# Patient Record
Sex: Female | Born: 1950
Health system: Southern US, Community
[De-identification: ages and names within clinical notes are randomized; demographics above are authoritative.]

## PROBLEM LIST (undated history)

## (undated) DIAGNOSIS — E8881 Metabolic syndrome: Secondary | ICD-10-CM

## (undated) DIAGNOSIS — E782 Mixed hyperlipidemia: Secondary | ICD-10-CM

## (undated) DIAGNOSIS — I10 Essential (primary) hypertension: Secondary | ICD-10-CM

## (undated) DIAGNOSIS — N183 Chronic kidney disease, stage 3 unspecified: Secondary | ICD-10-CM

## (undated) DIAGNOSIS — M1990 Unspecified osteoarthritis, unspecified site: Secondary | ICD-10-CM

---

## 2008-06-17 ENCOUNTER — Emergency Department (HOSPITAL_COMMUNITY): Admission: EM | Admit: 2008-06-17 | Discharge: 2008-06-17 | Payer: Self-pay | Admitting: Emergency Medicine

## 2015-01-25 HISTORY — PX: BRAIN SURGERY: SHX531

## 2017-04-08 DIAGNOSIS — Z6837 Body mass index (BMI) 37.0-37.9, adult: Secondary | ICD-10-CM | POA: Diagnosis not present

## 2017-04-08 DIAGNOSIS — I10 Essential (primary) hypertension: Secondary | ICD-10-CM | POA: Diagnosis not present

## 2017-04-08 DIAGNOSIS — E8881 Metabolic syndrome: Secondary | ICD-10-CM | POA: Diagnosis not present

## 2017-04-08 DIAGNOSIS — E782 Mixed hyperlipidemia: Secondary | ICD-10-CM | POA: Diagnosis not present

## 2017-04-08 DIAGNOSIS — Z79899 Other long term (current) drug therapy: Secondary | ICD-10-CM | POA: Diagnosis not present

## 2017-04-08 DIAGNOSIS — M1990 Unspecified osteoarthritis, unspecified site: Secondary | ICD-10-CM | POA: Diagnosis not present

## 2017-04-08 DIAGNOSIS — N183 Chronic kidney disease, stage 3 (moderate): Secondary | ICD-10-CM | POA: Diagnosis not present

## 2017-06-28 DIAGNOSIS — Z6837 Body mass index (BMI) 37.0-37.9, adult: Secondary | ICD-10-CM | POA: Diagnosis not present

## 2017-06-28 DIAGNOSIS — Z1339 Encounter for screening examination for other mental health and behavioral disorders: Secondary | ICD-10-CM | POA: Diagnosis not present

## 2017-06-28 DIAGNOSIS — I10 Essential (primary) hypertension: Secondary | ICD-10-CM | POA: Diagnosis not present

## 2017-06-28 DIAGNOSIS — N183 Chronic kidney disease, stage 3 (moderate): Secondary | ICD-10-CM | POA: Diagnosis not present

## 2017-06-28 DIAGNOSIS — M791 Myalgia, unspecified site: Secondary | ICD-10-CM | POA: Diagnosis not present

## 2017-06-28 DIAGNOSIS — Z1331 Encounter for screening for depression: Secondary | ICD-10-CM | POA: Diagnosis not present

## 2017-06-28 DIAGNOSIS — M1990 Unspecified osteoarthritis, unspecified site: Secondary | ICD-10-CM | POA: Diagnosis not present

## 2017-07-29 DIAGNOSIS — M791 Myalgia, unspecified site: Secondary | ICD-10-CM | POA: Diagnosis not present

## 2017-07-29 DIAGNOSIS — N183 Chronic kidney disease, stage 3 (moderate): Secondary | ICD-10-CM | POA: Diagnosis not present

## 2017-07-29 DIAGNOSIS — R700 Elevated erythrocyte sedimentation rate: Secondary | ICD-10-CM | POA: Diagnosis not present

## 2017-07-29 DIAGNOSIS — I10 Essential (primary) hypertension: Secondary | ICD-10-CM | POA: Diagnosis not present

## 2017-07-29 DIAGNOSIS — Z6837 Body mass index (BMI) 37.0-37.9, adult: Secondary | ICD-10-CM | POA: Diagnosis not present

## 2017-08-27 DIAGNOSIS — Z6837 Body mass index (BMI) 37.0-37.9, adult: Secondary | ICD-10-CM | POA: Diagnosis not present

## 2017-08-27 DIAGNOSIS — E669 Obesity, unspecified: Secondary | ICD-10-CM | POA: Diagnosis not present

## 2017-08-27 DIAGNOSIS — M2550 Pain in unspecified joint: Secondary | ICD-10-CM | POA: Diagnosis not present

## 2017-09-19 DIAGNOSIS — M2550 Pain in unspecified joint: Secondary | ICD-10-CM | POA: Diagnosis not present

## 2017-09-19 DIAGNOSIS — Z6839 Body mass index (BMI) 39.0-39.9, adult: Secondary | ICD-10-CM | POA: Diagnosis not present

## 2017-09-19 DIAGNOSIS — E669 Obesity, unspecified: Secondary | ICD-10-CM | POA: Diagnosis not present

## 2017-09-30 DIAGNOSIS — M1612 Unilateral primary osteoarthritis, left hip: Secondary | ICD-10-CM | POA: Diagnosis not present

## 2017-09-30 DIAGNOSIS — M25552 Pain in left hip: Secondary | ICD-10-CM | POA: Diagnosis not present

## 2017-10-14 DIAGNOSIS — Z79899 Other long term (current) drug therapy: Secondary | ICD-10-CM | POA: Diagnosis not present

## 2017-10-14 DIAGNOSIS — M160 Bilateral primary osteoarthritis of hip: Secondary | ICD-10-CM | POA: Diagnosis not present

## 2017-10-14 DIAGNOSIS — Z1331 Encounter for screening for depression: Secondary | ICD-10-CM | POA: Diagnosis not present

## 2017-10-14 DIAGNOSIS — N183 Chronic kidney disease, stage 3 (moderate): Secondary | ICD-10-CM | POA: Diagnosis not present

## 2017-10-14 DIAGNOSIS — Z01818 Encounter for other preprocedural examination: Secondary | ICD-10-CM | POA: Diagnosis not present

## 2017-10-14 DIAGNOSIS — E782 Mixed hyperlipidemia: Secondary | ICD-10-CM | POA: Diagnosis not present

## 2017-10-14 DIAGNOSIS — Z6836 Body mass index (BMI) 36.0-36.9, adult: Secondary | ICD-10-CM | POA: Diagnosis not present

## 2017-10-16 DIAGNOSIS — M1612 Unilateral primary osteoarthritis, left hip: Secondary | ICD-10-CM | POA: Diagnosis not present

## 2017-10-16 DIAGNOSIS — M25552 Pain in left hip: Secondary | ICD-10-CM | POA: Diagnosis not present

## 2017-10-16 NOTE — H&P (Signed)
TOTAL HIP ADMISSION H&P  Patient is admitted for left total hip arthroplasty, anterior approach  Subjective:  Chief Complaint:    Left hip primary OA / pain  HPI: Sherry Robinson, 67 y.o. female, has a history of pain and functional disability in the left hip(s) due to arthritis and patient has failed non-surgical conservative treatments for greater than 12 weeks to include NSAID's and/or analgesics, use of assistive devices and activity modification.  Onset of symptoms was gradual starting ~1 years ago with gradually worsening course since that time.The patient noted no past surgery on the left hip(s).  Patient currently rates pain in the left hip at 10 out of 10 with activity. Patient has worsening of pain with activity and weight bearing, trendelenberg gait, pain that interfers with activities of daily living and pain with passive range of motion. Patient has evidence of periarticular osteophytes and joint space narrowing by imaging studies. This condition presents safety issues increasing the risk of falls. There is no current active infection.  Risks, benefits and expectations were discussed with the patient.  Risks including but not limited to the risk of anesthesia, blood clots, nerve damage, blood vessel damage, failure of the prosthesis, infection and up to and including death.  Patient understand the risks, benefits and expectations and wishes to proceed with surgery.   PCP: [facility]  D/C Plans:       Home  Post-op Meds:       No Rx given   Tranexamic Acid:      To be given - IV   Decadron:      Is to be given  FYI:      ASA  Norco  DME:   Rx given for - RW & 3-n-1  PT:   No PT    Past Medical History:  Diagnosis Date  . Arthritis   . Chronic kidney disease (CKD), stage III (moderate) (HCC)   . Hypercholesterolemia with hypertriglyceridemia   . Hypertension   . Metabolic syndrome   . Morbid obesity ([place])     Past Surgical History:  Procedure  Laterality Date  . BRAIN SURGERY  01/2015   pituitary adenoma removal      No current facility-administered medications for this encounter.    Current Outpatient Medications  Medication Sig Dispense Refill Last Dose  . acetaminophen (TYLENOL) 500 MG tablet Take 1,000 mg by mouth every 6 (six) hours as needed (for pain.).     Marland Kitchen cholecalciferol (VITAMIN D) 1000 units tablet Take 1,000 Units by mouth daily.     Marland Kitchen losartan-hydrochlorothiazide (HYZAAR) 100-12.5 MG tablet Take 1 tablet by mouth daily.  0   . rosuvastatin (CRESTOR) 20 MG tablet Take 20 mg by mouth daily.   1    Allergies  Allergen Reactions  . Lisinopril Cough     Social History   Tobacco Use  . Smoking status: Former Smoker    Last attempt to quit: 10/20/2016    Years since quitting: 1.0  . Smokeless tobacco: Never Used  Substance Use Topics  . Alcohol use: Never    Frequency: Never       Review of Systems  Constitutional: Negative.   HENT: Negative.   Eyes: Negative.   Respiratory: Negative.   Cardiovascular: Negative.   Gastrointestinal: Negative.   Genitourinary: Negative.   Musculoskeletal: Positive for joint pain.  Skin: Negative.   Neurological: Negative.   Endo/Heme/Allergies: Negative.   Psychiatric/Behavioral: Negative.     Objective:  Physical  Exam  Constitutional: She is oriented to person, place, and time. She appears well-developed.  HENT:  Head: Normocephalic.  Eyes: Pupils are equal, round, and reactive to light.  Neck: Neck supple. No JVD present. No tracheal deviation present. No thyromegaly present.  Cardiovascular: Normal rate, regular rhythm and intact distal pulses.  Respiratory: Effort normal and breath sounds normal. No respiratory distress. She has no wheezes.  GI: Soft. There is no tenderness. There is no guarding.  Musculoskeletal:       Left hip: She exhibits decreased range of motion, decreased strength, tenderness and bony tenderness. She exhibits no swelling, no  deformity and no laceration.  Lymphadenopathy:    She has no cervical adenopathy.  Neurological: She is alert and oriented to person, place, and time.  Skin: Skin is warm and dry.  Psychiatric: She has a normal mood and affect.      Imaging Review Plain radiographs demonstrate severe degenerative joint disease of the left hip(s). The bone quality appears to be good for age and reported activity level.    Preoperative templating of the joint replacement has been completed, documented, and submitted to the Operating Room personnel in order to optimize intra-operative equipment management.     Assessment/Plan:  End stage arthritis, left hip  The patient history, physical examination, clinical judgement of the provider and imaging studies are consistent with end stage degenerative joint disease of the left hip and total hip arthroplasty is deemed medically necessary. The treatment options including medical management, injection therapy, arthroscopy and arthroplasty were discussed at length. The risks and benefits of total hip arthroplasty were presented and reviewed. The risks due to aseptic loosening, infection, stiffness, dislocation/subluxation,  thromboembolic complications and other imponderables were discussed.  The patient acknowledged the explanation, agreed to proceed with the plan and consent was signed. Patient is being admitted for inpatient treatment for surgery, pain control, PT, OT, prophylactic antibiotics, VTE prophylaxis, progressive ambulation and ADL's and discharge planning.The patient is planning to be discharged home.      West Pugh Dervin Vore   PA-C  10/25/2017, 8:57 AM

## 2017-10-21 ENCOUNTER — Encounter (HOSPITAL_COMMUNITY): Payer: Self-pay

## 2017-10-21 NOTE — Progress Notes (Signed)
LOV/SURGICAL CLEARANCE 10-14-17, DR. [address]RIS [address] ON CHART  CBCDIFF [phone] ON CHART FROM [facility]

## 2017-10-21 NOTE — Patient Instructions (Signed)
Sherry Robinson  10/21/2017   Your procedure is scheduled on: 10-29-17   Report to [facility] Main  Entrance    Report to admitting at 6:00AM    Call this number if you have problems the morning of surgery [phone]     Remember: Do not eat food or drink liquids :After Midnight.     Take these medicines the morning of surgery with A SIP OF WATER: TYLENOL IF NEEDED, ROSUVASTATIN                                 You may not have any metal on your body including hair pins and              piercings  Do not wear jewelry, make-up, lotions, powders or perfumes, deodorant             Do not wear nail polish.  Do not shave  48 hours prior to surgery.                 Do not bring valuables to the hospital. [name].  Contacts, dentures or bridgework may not be worn into surgery.  Leave suitcase in the car. After surgery it may be brought to your room.                Please read over the following fact sheets you were given: _____________________________________________________________________             [name] - Preparing for Surgery Before surgery, you can play an important role.  Because skin is not sterile, your skin needs to be as free of germs as possible.  You can reduce the number of germs on your skin by washing with CHG (chlorahexidine gluconate) soap before surgery.  CHG is an antiseptic cleaner which kills germs and bonds with the skin to continue killing germs even after washing. Please DO NOT use if you have an allergy to CHG or antibacterial soaps.  If your skin becomes reddened/irritated stop using the CHG and inform your nurse when you arrive at Short Stay. Do not shave (including legs and underarms) for at least 48 hours prior to the first CHG shower.  You may shave your face/neck. Please follow these instructions carefully:  1.  Shower with CHG Soap the night before surgery and the   morning of Surgery.  2.  If you choose to wash your hair, wash your hair first as usual with your  normal  shampoo.  3.  After you shampoo, rinse your hair and body thoroughly to remove the  shampoo.                           4.  Use CHG as you would any other liquid soap.  You can apply chg directly  to the skin and wash                       Gently with a scrungie or clean washcloth.  5.  Apply the CHG Soap to your body ONLY FROM THE NECK DOWN.   Do not use on face/ open  Wound or open sores. Avoid contact with eyes, ears mouth and genitals (private parts).                       Wash face,  Genitals (private parts) with your normal soap.             6.  Wash thoroughly, paying special attention to the area where your surgery  will be performed.  7.  Thoroughly rinse your body with warm water from the neck down.  8.  DO NOT shower/wash with your normal soap after using and rinsing off  the CHG Soap.                9.  Pat yourself dry with a clean towel.            10.  Wear clean pajamas.            11.  Place clean sheets on your bed the night of your first shower and do not  sleep with pets. Day of Surgery : Do not apply any lotions/deodorants the morning of surgery.  Please wear clean clothes to the hospital/surgery center.  FAILURE TO FOLLOW THESE INSTRUCTIONS MAY RESULT IN THE CANCELLATION OF YOUR SURGERY PATIENT SIGNATURE_________________________________  NURSE SIGNATURE__________________________________  ________________________________________________________________________   Sherry Robinson  An incentive spirometer is a tool that can help keep your lungs clear and active. This tool measures how well you are filling your lungs with each breath. Taking long deep breaths may help reverse or decrease the chance of developing breathing (pulmonary) problems (especially infection) following:  A long period of time when you are unable to move or be  active. BEFORE THE PROCEDURE   If the spirometer includes an indicator to show your best effort, your nurse or respiratory therapist will set it to a desired goal.  If possible, sit up straight or lean slightly forward. Try not to slouch.  Hold the incentive spirometer in an upright position. INSTRUCTIONS FOR USE  1. Sit on the edge of your bed if possible, or sit up as far as you can in bed or on a chair. 2. Hold the incentive spirometer in an upright position. 3. Breathe out normally. 4. Place the mouthpiece in your mouth and seal your lips tightly around it. 5. Breathe in slowly and as deeply as possible, raising the piston or the ball toward the top of the column. 6. Hold your breath for 3-5 seconds or for as long as possible. Allow the piston or ball to fall to the bottom of the column. 7. Remove the mouthpiece from your mouth and breathe out normally. 8. Rest for a few seconds and repeat Steps 1 through 7 at least 10 times every 1-2 hours when you are awake. Take your time and take a few normal breaths between deep breaths. 9. The spirometer may include an indicator to show your best effort. Use the indicator as a goal to work toward during each repetition. 10. After each set of 10 deep breaths, practice coughing to be sure your lungs are clear. If you have an incision (the cut made at the time of surgery), support your incision when coughing by placing a pillow or rolled up towels firmly against it. Once you are able to get out of bed, walk around indoors and cough well. You may stop using the incentive spirometer when instructed by your caregiver.  RISKS AND COMPLICATIONS  Take your time so you do not get  dizzy or light-headed.  If you are in pain, you may need to take or ask for pain medication before doing incentive spirometry. It is harder to take a deep breath if you are having pain. AFTER USE  Rest and breathe slowly and easily.  It can be helpful to keep track of a log of  your progress. Your caregiver can provide you with a simple table to help with this. If you are using the spirometer at home, follow these instructions: [place] IF:   You are having difficultly using the spirometer.  You have trouble using the spirometer as often as instructed.  Your pain medication is not giving enough relief while using the spirometer.  You develop fever of 100.5 F (38.1 C) or higher. SEEK IMMEDIATE MEDICAL CARE IF:   You cough up bloody sputum that had not been present before.  You develop fever of 102 F (38.9 C) or greater.  You develop worsening pain at or near the incision site. MAKE SURE YOU:   Understand these instructions.  Will watch your condition.  Will get help right away if you are not doing well or get worse. Document Released: 07/23/2006 Document Revised: 06/04/2011 Document Reviewed: 09/23/2006 Exit[employer] Patient Information 2014 [employer], [place].   ________________________________________________________________________  WHAT IS A BLOOD TRANSFUSION? Blood Transfusion Information  A transfusion is the replacement of blood or some of its parts. Blood is made up of multiple cells which provide different functions.  Red blood cells carry oxygen and are used for blood loss replacement.  White blood cells fight against infection.  Platelets control bleeding.  Plasma helps clot blood.  Other blood products are available for specialized needs, such as hemophilia or other clotting disorders. BEFORE THE TRANSFUSION  Who gives blood for transfusions?   Healthy volunteers who are fully evaluated to make sure their blood is safe. This is blood bank blood. Transfusion therapy is the safest it has ever been in the practice of medicine. Before blood is taken from a donor, a complete history is taken to make sure that person has no history of diseases nor engages in risky social behavior (examples are intravenous drug use or sexual activity  with multiple partners). The donor's travel history is screened to minimize risk of transmitting infections, such as malaria. The donated blood is tested for signs of infectious diseases, such as HIV and hepatitis. The blood is then tested to be sure it is compatible with you in order to minimize the chance of a transfusion reaction. If you or a relative donates blood, this is often done in anticipation of surgery and is not appropriate for emergency situations. It takes many days to process the donated blood. RISKS AND COMPLICATIONS Although transfusion therapy is very safe and saves many lives, the main dangers of transfusion include:   Getting an infectious disease.  Developing a transfusion reaction. This is an allergic reaction to something in the blood you were given. Every precaution is taken to prevent this. The decision to have a blood transfusion has been considered carefully by your caregiver before blood is given. Blood is not given unless the benefits outweigh the risks. AFTER THE TRANSFUSION  Right after receiving a blood transfusion, you will usually feel much better and more energetic. This is especially true if your red blood cells have gotten low (anemic). The transfusion raises the level of the red blood cells which carry oxygen, and this usually causes an energy increase.  The nurse administering the transfusion will  monitor you carefully for complications. HOME CARE INSTRUCTIONS  No special instructions are needed after a transfusion. You may find your energy is better. Speak with your caregiver about any limitations on activity for underlying diseases you may have. SEEK MEDICAL CARE IF:   Your condition is not improving after your transfusion.  You develop redness or irritation at the intravenous (IV) site. SEEK IMMEDIATE MEDICAL CARE IF:  Any of the following symptoms occur over the next 12 hours:  Shaking chills.  You have a temperature by mouth above 102 F (38.9  C), not controlled by medicine.  Chest, back, or muscle pain.  People around you feel you are not acting correctly or are confused.  Shortness of breath or difficulty breathing.  Dizziness and fainting.  You get a rash or develop hives.  You have a decrease in urine output.  Your urine turns a dark color or changes to pink, red, or brown. Any of the following symptoms occur over the next 10 days:  You have a temperature by mouth above 102 F (38.9 C), not controlled by medicine.  Shortness of breath.  Weakness after normal activity.  The white part of the eye turns yellow (jaundice).  You have a decrease in the amount of urine or are urinating less often.  Your urine turns a dark color or changes to pink, red, or brown. Document Released: 03/09/2000 Document Revised: 06/04/2011 Document Reviewed: 10/27/2007 [facility] Patient Information 2014 [place], [place].  _______________________________________________________________________

## 2017-10-23 ENCOUNTER — Other Ambulatory Visit: Payer: Self-pay

## 2017-10-23 ENCOUNTER — Encounter (HOSPITAL_COMMUNITY): Payer: Self-pay | Admitting: Emergency Medicine

## 2017-10-23 ENCOUNTER — Encounter (HOSPITAL_COMMUNITY)
Admission: RE | Admit: 2017-10-23 | Discharge: 2017-10-23 | Disposition: A | Discharge status: Home / Self Care | Payer: Medicare HMO | Source: Ambulatory Visit | Attending: Orthopedic Surgery | Admitting: Orthopedic Surgery

## 2017-10-23 DIAGNOSIS — M1612 Unilateral primary osteoarthritis, left hip: Secondary | ICD-10-CM | POA: Insufficient documentation

## 2017-10-23 DIAGNOSIS — Z01812 Encounter for preprocedural laboratory examination: Secondary | ICD-10-CM | POA: Insufficient documentation

## 2017-10-23 HISTORY — DX: Unspecified osteoarthritis, unspecified site: M19.90

## 2017-10-23 HISTORY — DX: Morbid (severe) obesity due to excess calories: E66.01

## 2017-10-23 HISTORY — DX: Chronic kidney disease, stage 3 unspecified: N18.30

## 2017-10-23 HISTORY — DX: Chronic kidney disease, stage 3 (moderate): N18.3

## 2017-10-23 HISTORY — DX: Metabolic syndrome: E88.810

## 2017-10-23 HISTORY — DX: Mixed hyperlipidemia: E78.2

## 2017-10-23 HISTORY — DX: Metabolic syndrome: E88.81

## 2017-10-23 HISTORY — DX: Essential (primary) hypertension: I10

## 2017-10-23 LAB — ABO/RH: ABO/RH(D): O POS

## 2017-10-23 LAB — SURGICAL PCR SCREEN
MRSA, PCR: NEGATIVE
Staphylococcus aureus: POSITIVE — AB

## 2017-10-23 NOTE — Progress Notes (Signed)
EKG 10-14-17 ON CHART FROM [facility]

## 2017-10-28 DIAGNOSIS — M1612 Unilateral primary osteoarthritis, left hip: Secondary | ICD-10-CM | POA: Diagnosis not present

## 2017-10-28 NOTE — Anesthesia Preprocedure Evaluation (Addendum)
Anesthesia Evaluation  Patient identified by MRN, date of birth, ID band Patient awake    Reviewed: Allergy & Precautions, H&P , NPO status , Patient's Chart, lab work & pertinent test results, reviewed documented beta blocker date and time   Airway Mallampati: I  TM Distance: >3 FB Neck ROM: full    Dental no notable dental hx. (+) Edentulous Upper, Upper Dentures, Poor Dentition, Chipped, Missing,    Pulmonary neg pulmonary ROS, former smoker,    Pulmonary exam normal breath sounds clear to auscultation       Cardiovascular Exercise Tolerance: Good hypertension, negative cardio ROS   Rhythm:regular Rate:Normal     Neuro/Psych negative neurological ROS  negative psych ROS   GI/Hepatic negative GI ROS, Neg liver ROS,   Endo/Other  Morbid obesity  Renal/GU Renal disease  negative genitourinary   Musculoskeletal  (+) Arthritis , Osteoarthritis,    Abdominal   Peds  Hematology negative hematology ROS (+)   Anesthesia Other Findings   Reproductive/Obstetrics negative OB ROS                            Anesthesia Physical Anesthesia Plan  ASA: III  Anesthesia Plan: Spinal   Post-op Pain Management:    Induction:   PONV Risk Score and Plan: 3 and Treatment may vary due to age or medical condition, Ondansetron and Dexamethasone  Airway Management Planned: Nasal Cannula, Natural Airway and Mask  Additional Equipment:   Intra-op Plan:   Post-operative Plan:   Informed Consent: I have reviewed the patients History and Physical, chart, labs and discussed the procedure including the risks, benefits and alternatives for the proposed anesthesia with the patient or authorized representative who has indicated his/her understanding and acceptance.   Dental Advisory Given  Plan Discussed with: CRNA, Anesthesiologist and Surgeon  Anesthesia Plan Comments: (  )        Anesthesia  Quick Evaluation

## 2017-10-29 ENCOUNTER — Other Ambulatory Visit: Payer: Self-pay

## 2017-10-29 ENCOUNTER — Inpatient Hospital Stay (HOSPITAL_COMMUNITY): Payer: Medicare HMO | Admitting: Anesthesiology

## 2017-10-29 ENCOUNTER — Inpatient Hospital Stay (HOSPITAL_COMMUNITY): Payer: Medicare HMO

## 2017-10-29 ENCOUNTER — Encounter (HOSPITAL_COMMUNITY): Payer: Self-pay | Admitting: Certified Registered"

## 2017-10-29 ENCOUNTER — Encounter (HOSPITAL_COMMUNITY)
Admission: RE | Disposition: A | Discharge status: Home / Self Care | Payer: Self-pay | Source: Home / Self Care | Attending: Orthopedic Surgery

## 2017-10-29 ENCOUNTER — Inpatient Hospital Stay (HOSPITAL_COMMUNITY)
Admission: RE | Admit: 2017-10-29 | Discharge: 2017-10-31 | DRG: 470 | Disposition: A | Discharge status: Home / Self Care | Payer: Medicare HMO | Attending: Orthopedic Surgery | Admitting: Orthopedic Surgery

## 2017-10-29 DIAGNOSIS — Z87891 Personal history of nicotine dependence: Secondary | ICD-10-CM

## 2017-10-29 DIAGNOSIS — E781 Pure hyperglyceridemia: Secondary | ICD-10-CM | POA: Diagnosis present

## 2017-10-29 DIAGNOSIS — Z6837 Body mass index (BMI) 37.0-37.9, adult: Secondary | ICD-10-CM

## 2017-10-29 DIAGNOSIS — Z96649 Presence of unspecified artificial hip joint: Secondary | ICD-10-CM

## 2017-10-29 DIAGNOSIS — I129 Hypertensive chronic kidney disease with stage 1 through stage 4 chronic kidney disease, or unspecified chronic kidney disease: Secondary | ICD-10-CM | POA: Diagnosis present

## 2017-10-29 DIAGNOSIS — E7800 Pure hypercholesterolemia, unspecified: Secondary | ICD-10-CM | POA: Diagnosis not present

## 2017-10-29 DIAGNOSIS — Z96642 Presence of left artificial hip joint: Secondary | ICD-10-CM | POA: Diagnosis not present

## 2017-10-29 DIAGNOSIS — Z79899 Other long term (current) drug therapy: Secondary | ICD-10-CM | POA: Diagnosis not present

## 2017-10-29 DIAGNOSIS — E669 Obesity, unspecified: Secondary | ICD-10-CM | POA: Diagnosis present

## 2017-10-29 DIAGNOSIS — Z888 Allergy status to other drugs, medicaments and biological substances status: Secondary | ICD-10-CM | POA: Diagnosis not present

## 2017-10-29 DIAGNOSIS — M1612 Unilateral primary osteoarthritis, left hip: Principal | ICD-10-CM | POA: Diagnosis present

## 2017-10-29 DIAGNOSIS — Z471 Aftercare following joint replacement surgery: Secondary | ICD-10-CM | POA: Diagnosis not present

## 2017-10-29 DIAGNOSIS — N183 Chronic kidney disease, stage 3 (moderate): Secondary | ICD-10-CM | POA: Diagnosis present

## 2017-10-29 HISTORY — PX: TOTAL HIP ARTHROPLASTY: SHX124

## 2017-10-29 LAB — BASIC METABOLIC PANEL
Anion gap: 11 (ref 5–15)
BUN: 24 mg/dL — ABNORMAL HIGH (ref 8–23)
CALCIUM: 10.1 mg/dL (ref 8.9–10.3)
CHLORIDE: 105 mmol/L (ref 98–111)
CO2: 25 mmol/L (ref 22–32)
Creatinine, Ser: 1.39 mg/dL — ABNORMAL HIGH (ref 0.44–1.00)
GFR calc Af Amer: 44 mL/min — ABNORMAL LOW (ref >60)
GFR, EST NON AFRICAN AMERICAN: 38 mL/min — AB (ref >60)
Glucose, Bld: 103 mg/dL — ABNORMAL HIGH (ref 70–99)
POTASSIUM: 3.9 mmol/L (ref 3.5–5.1)
SODIUM: 141 mmol/L (ref 135–145)

## 2017-10-29 LAB — TYPE AND SCREEN
ABO/RH(D): O POS
Antibody Screen: NEGATIVE

## 2017-10-29 SURGERY — ARTHROPLASTY, HIP, TOTAL, ANTERIOR APPROACH
Anesthesia: Spinal | Site: Hip | Laterality: Left

## 2017-10-29 MED ORDER — METHOCARBAMOL 500 MG IVPB - SIMPLE MED
500.0000 mg | Freq: Four times a day (QID) | INTRAVENOUS | Status: DC | PRN
  Administered 2017-10-30: 500 mg via INTRAVENOUS
  Filled 2017-10-29: qty 500

## 2017-10-29 MED ORDER — PHENYLEPHRINE 40 MCG/ML (10ML) SYRINGE FOR IV PUSH (FOR BLOOD PRESSURE SUPPORT)
PREFILLED_SYRINGE | INTRAVENOUS | Status: AC
  Filled 2017-10-29: qty 10

## 2017-10-29 MED ORDER — PHENYLEPHRINE 40 MCG/ML (10ML) SYRINGE FOR IV PUSH (FOR BLOOD PRESSURE SUPPORT)
PREFILLED_SYRINGE | INTRAVENOUS | Status: DC | PRN
  Administered 2017-10-29 (×2): 40 ug via INTRAVENOUS
  Administered 2017-10-29: 80 ug via INTRAVENOUS
  Administered 2017-10-29: 40 ug via INTRAVENOUS
  Administered 2017-10-29: 80 ug via INTRAVENOUS

## 2017-10-29 MED ORDER — DEXAMETHASONE SODIUM PHOSPHATE 10 MG/ML IJ SOLN
10.0000 mg | Freq: Once | INTRAMUSCULAR | Status: AC
  Administered 2017-10-29: 10 mg via INTRAVENOUS

## 2017-10-29 MED ORDER — DOCUSATE SODIUM 100 MG PO CAPS: 100.0000 mg | ORAL_CAPSULE | Freq: Two times a day (BID) | ORAL | 0 refills | Status: DC

## 2017-10-29 MED ORDER — ASPIRIN 81 MG PO CHEW: 81.0000 mg | CHEWABLE_TABLET | Freq: Two times a day (BID) | ORAL | 0 refills | Status: AC

## 2017-10-29 MED ORDER — ONDANSETRON HCL 4 MG/2ML IJ SOLN: 4.0000 mg | Freq: Four times a day (QID) | INTRAMUSCULAR | Status: DC | PRN

## 2017-10-29 MED ORDER — DEXAMETHASONE SODIUM PHOSPHATE 10 MG/ML IJ SOLN
10.0000 mg | Freq: Once | INTRAMUSCULAR | Status: AC
  Administered 2017-10-30: 10 mg via INTRAVENOUS
  Filled 2017-10-29: qty 1

## 2017-10-29 MED ORDER — FENTANYL CITRATE (PF) 100 MCG/2ML IJ SOLN
25.0000 ug | INTRAMUSCULAR | Status: DC | PRN
  Administered 2017-10-29: 25 ug via INTRAVENOUS

## 2017-10-29 MED ORDER — MORPHINE SULFATE (PF) 2 MG/ML IV SOLN
0.5000 mg | INTRAVENOUS | Status: DC | PRN
  Administered 2017-10-29: 0.5 mg via INTRAVENOUS
  Filled 2017-10-29: qty 1

## 2017-10-29 MED ORDER — HYDROCHLOROTHIAZIDE 12.5 MG PO CAPS
12.5000 mg | ORAL_CAPSULE | Freq: Every day | ORAL | Status: DC
  Administered 2017-10-30 – 2017-10-31 (×2): 12.5 mg via ORAL
  Filled 2017-10-29 (×2): qty 1

## 2017-10-29 MED ORDER — PHENOL 1.4 % MT LIQD: 1.0000 | OROMUCOSAL | Status: DC | PRN

## 2017-10-29 MED ORDER — MENTHOL 3 MG MT LOZG: 1.0000 | LOZENGE | OROMUCOSAL | Status: DC | PRN

## 2017-10-29 MED ORDER — ONDANSETRON HCL 4 MG PO TABS: 4.0000 mg | ORAL_TABLET | Freq: Four times a day (QID) | ORAL | Status: DC | PRN

## 2017-10-29 MED ORDER — CEFAZOLIN SODIUM-DEXTROSE 2-4 GM/100ML-% IV SOLN
2.0000 g | Freq: Four times a day (QID) | INTRAVENOUS | Status: AC
  Administered 2017-10-29 (×2): 2 g via INTRAVENOUS
  Filled 2017-10-29 (×2): qty 100

## 2017-10-29 MED ORDER — HYDROCODONE-ACETAMINOPHEN 7.5-325 MG PO TABS
1.0000 | ORAL_TABLET | ORAL | Status: DC | PRN
  Administered 2017-10-29: 1 via ORAL
  Administered 2017-10-29: 2 via ORAL
  Filled 2017-10-29: qty 2
  Filled 2017-10-29: qty 1

## 2017-10-29 MED ORDER — FERROUS SULFATE 325 (65 FE) MG PO TABS: 325.0000 mg | ORAL_TABLET | Freq: Three times a day (TID) | ORAL | 3 refills | Status: DC

## 2017-10-29 MED ORDER — DEXAMETHASONE SODIUM PHOSPHATE 10 MG/ML IJ SOLN
INTRAMUSCULAR | Status: AC
  Filled 2017-10-29: qty 1

## 2017-10-29 MED ORDER — ASPIRIN 81 MG PO CHEW
81.0000 mg | CHEWABLE_TABLET | Freq: Two times a day (BID) | ORAL | Status: DC
  Administered 2017-10-29 – 2017-10-31 (×4): 81 mg via ORAL
  Filled 2017-10-29 (×4): qty 1

## 2017-10-29 MED ORDER — FENTANYL CITRATE (PF) 100 MCG/2ML IJ SOLN
INTRAMUSCULAR | Status: AC
  Filled 2017-10-29: qty 2

## 2017-10-29 MED ORDER — METOCLOPRAMIDE HCL 5 MG/ML IJ SOLN: 5.0000 mg | Freq: Three times a day (TID) | INTRAMUSCULAR | Status: DC | PRN

## 2017-10-29 MED ORDER — BISACODYL 10 MG RE SUPP: 10.0000 mg | Freq: Every day | RECTAL | Status: DC | PRN

## 2017-10-29 MED ORDER — LOSARTAN POTASSIUM 50 MG PO TABS
100.0000 mg | ORAL_TABLET | Freq: Every day | ORAL | Status: DC
  Administered 2017-10-30 – 2017-10-31 (×2): 100 mg via ORAL
  Filled 2017-10-29 (×2): qty 2

## 2017-10-29 MED ORDER — POLYETHYLENE GLYCOL 3350 17 G PO PACK
17.0000 g | PACK | Freq: Two times a day (BID) | ORAL | Status: DC
  Administered 2017-10-29 – 2017-10-30 (×2): 17 g via ORAL
  Filled 2017-10-29 (×3): qty 1

## 2017-10-29 MED ORDER — POLYETHYLENE GLYCOL 3350 17 G PO PACK: 17.0000 g | PACK | Freq: Two times a day (BID) | ORAL | 0 refills | Status: DC

## 2017-10-29 MED ORDER — ROSUVASTATIN CALCIUM 20 MG PO TABS
20.0000 mg | ORAL_TABLET | Freq: Every day | ORAL | Status: DC
  Administered 2017-10-30 – 2017-10-31 (×2): 20 mg via ORAL
  Filled 2017-10-29 (×2): qty 1

## 2017-10-29 MED ORDER — MIDAZOLAM HCL 5 MG/ML IJ SOLN
INTRAMUSCULAR | Status: DC | PRN
  Administered 2017-10-29: 2 mg via INTRAVENOUS

## 2017-10-29 MED ORDER — PROPOFOL 10 MG/ML IV BOLUS
INTRAVENOUS | Status: AC
  Filled 2017-10-29: qty 80

## 2017-10-29 MED ORDER — METHOCARBAMOL 500 MG PO TABS: 500.0000 mg | ORAL_TABLET | Freq: Four times a day (QID) | ORAL | 0 refills | Status: DC | PRN

## 2017-10-29 MED ORDER — LACTATED RINGERS IV SOLN
INTRAVENOUS | Status: DC
Start: 2017-10-29 — End: 2017-10-29
  Administered 2017-10-29 (×2): via INTRAVENOUS

## 2017-10-29 MED ORDER — BUPIVACAINE IN DEXTROSE 0.75-8.25 % IT SOLN
INTRATHECAL | Status: DC | PRN
  Administered 2017-10-29: 1.8 mL via INTRATHECAL

## 2017-10-29 MED ORDER — ACETAMINOPHEN 160 MG/5ML PO SOLN: 325.0000 mg | ORAL | Status: DC | PRN

## 2017-10-29 MED ORDER — TRANEXAMIC ACID 1000 MG/10ML IV SOLN
1000.0000 mg | INTRAVENOUS | Status: AC
  Administered 2017-10-29: 1000 mg via INTRAVENOUS
  Filled 2017-10-29: qty 10

## 2017-10-29 MED ORDER — OXYCODONE HCL 5 MG PO TABS: 5.0000 mg | ORAL_TABLET | Freq: Once | ORAL | Status: DC | PRN

## 2017-10-29 MED ORDER — DIPHENHYDRAMINE HCL 12.5 MG/5ML PO ELIX: 12.5000 mg | ORAL_SOLUTION | ORAL | Status: DC | PRN

## 2017-10-29 MED ORDER — SODIUM CHLORIDE 0.9 % IR SOLN
Status: DC | PRN
  Administered 2017-10-29: 1000 mL

## 2017-10-29 MED ORDER — ACETAMINOPHEN 325 MG PO TABS
325.0000 mg | ORAL_TABLET | Freq: Four times a day (QID) | ORAL | Status: DC | PRN
  Administered 2017-10-30: 650 mg via ORAL
  Filled 2017-10-29: qty 2

## 2017-10-29 MED ORDER — METHOCARBAMOL 500 MG PO TABS
500.0000 mg | ORAL_TABLET | Freq: Four times a day (QID) | ORAL | Status: DC | PRN
  Administered 2017-10-30 (×3): 500 mg via ORAL
  Filled 2017-10-29 (×3): qty 1

## 2017-10-29 MED ORDER — METOCLOPRAMIDE HCL 5 MG PO TABS: 5.0000 mg | ORAL_TABLET | Freq: Three times a day (TID) | ORAL | Status: DC | PRN

## 2017-10-29 MED ORDER — HYDROCODONE-ACETAMINOPHEN 5-325 MG PO TABS
1.0000 | ORAL_TABLET | ORAL | Status: DC | PRN
  Administered 2017-10-29: 1 via ORAL
  Administered 2017-10-31 (×2): 2 via ORAL
  Filled 2017-10-29 (×2): qty 2
  Filled 2017-10-29: qty 1

## 2017-10-29 MED ORDER — ALUM & MAG HYDROXIDE-SIMETH 200-200-20 MG/5ML PO SUSP: 15.0000 mL | ORAL | Status: DC | PRN

## 2017-10-29 MED ORDER — SODIUM CHLORIDE 0.9 % IV SOLN
INTRAVENOUS | Status: DC | PRN
  Administered 2017-10-29: 25 ug/min via INTRAVENOUS

## 2017-10-29 MED ORDER — OXYCODONE HCL 5 MG/5ML PO SOLN
5.0000 mg | Freq: Once | ORAL | Status: DC | PRN
  Filled 2017-10-29: qty 5

## 2017-10-29 MED ORDER — CHLORHEXIDINE GLUCONATE 4 % EX LIQD: 60.0000 mL | Freq: Once | CUTANEOUS | Status: DC

## 2017-10-29 MED ORDER — CELECOXIB 200 MG PO CAPS
200.0000 mg | ORAL_CAPSULE | Freq: Two times a day (BID) | ORAL | Status: DC
  Administered 2017-10-29 – 2017-10-31 (×4): 200 mg via ORAL
  Filled 2017-10-29 (×4): qty 1

## 2017-10-29 MED ORDER — DOCUSATE SODIUM 100 MG PO CAPS
100.0000 mg | ORAL_CAPSULE | Freq: Two times a day (BID) | ORAL | Status: DC
  Administered 2017-10-29 – 2017-10-31 (×4): 100 mg via ORAL
  Filled 2017-10-29 (×4): qty 1

## 2017-10-29 MED ORDER — CEFAZOLIN SODIUM-DEXTROSE 2-4 GM/100ML-% IV SOLN
2.0000 g | INTRAVENOUS | Status: AC
  Administered 2017-10-29: 2 g via INTRAVENOUS
  Filled 2017-10-29: qty 100

## 2017-10-29 MED ORDER — ACETAMINOPHEN 325 MG PO TABS: 325.0000 mg | ORAL_TABLET | ORAL | Status: DC | PRN

## 2017-10-29 MED ORDER — TRANEXAMIC ACID 1000 MG/10ML IV SOLN
1000.0000 mg | Freq: Once | INTRAVENOUS | Status: AC
  Administered 2017-10-29: 1000 mg via INTRAVENOUS
  Filled 2017-10-29: qty 1000

## 2017-10-29 MED ORDER — SODIUM CHLORIDE 0.9 % IV SOLN
INTRAVENOUS | Status: DC
  Administered 2017-10-29 (×2): via INTRAVENOUS

## 2017-10-29 MED ORDER — ONDANSETRON HCL 4 MG/2ML IJ SOLN
4.0000 mg | Freq: Once | INTRAMUSCULAR | Status: AC | PRN
  Administered 2017-10-29: 4 mg via INTRAVENOUS

## 2017-10-29 MED ORDER — PROPOFOL 500 MG/50ML IV EMUL
INTRAVENOUS | Status: DC | PRN
  Administered 2017-10-29: 100 ug/kg/min via INTRAVENOUS

## 2017-10-29 MED ORDER — MIDAZOLAM HCL 2 MG/2ML IJ SOLN
INTRAMUSCULAR | Status: AC
  Filled 2017-10-29: qty 2

## 2017-10-29 MED ORDER — HYDROCODONE-ACETAMINOPHEN 7.5-325 MG PO TABS: 1.0000 | ORAL_TABLET | ORAL | 0 refills | Status: DC | PRN

## 2017-10-29 MED ORDER — MEPERIDINE HCL 50 MG/ML IJ SOLN: 6.2500 mg | INTRAMUSCULAR | Status: DC | PRN

## 2017-10-29 MED ORDER — LOSARTAN POTASSIUM-HCTZ 100-12.5 MG PO TABS: 1.0000 | ORAL_TABLET | Freq: Every day | ORAL | Status: DC

## 2017-10-29 MED ORDER — PROPOFOL 10 MG/ML IV BOLUS
INTRAVENOUS | Status: AC
  Filled 2017-10-29: qty 40

## 2017-10-29 MED ORDER — ONDANSETRON HCL 4 MG/2ML IJ SOLN
INTRAMUSCULAR | Status: AC
  Filled 2017-10-29: qty 2

## 2017-10-29 MED ORDER — MAGNESIUM CITRATE PO SOLN: 1.0000 | Freq: Once | ORAL | Status: DC | PRN

## 2017-10-29 MED ORDER — FERROUS SULFATE 325 (65 FE) MG PO TABS
325.0000 mg | ORAL_TABLET | Freq: Three times a day (TID) | ORAL | Status: DC
  Administered 2017-10-30 – 2017-10-31 (×4): 325 mg via ORAL
  Filled 2017-10-29 (×4): qty 1

## 2017-10-29 SURGICAL SUPPLY — 40 items
BAG DECANTER FOR FLEXI CONT (MISCELLANEOUS) IMPLANT
BAG ZIPLOCK 12X15 (MISCELLANEOUS) IMPLANT
BLADE SAG 18X100X1.27 (BLADE) ×2 IMPLANT
COVER PERINEAL POST (MISCELLANEOUS) ×2 IMPLANT
COVER SURGICAL LIGHT HANDLE (MISCELLANEOUS) ×2 IMPLANT
CUP ACETBLR 52 OD PINNACLE (Hips) ×2 IMPLANT
DERMABOND ADVANCED (GAUZE/BANDAGES/DRESSINGS) ×1
DERMABOND ADVANCED .7 DNX12 (GAUZE/BANDAGES/DRESSINGS) ×1 IMPLANT
DRAPE STERI IOBAN 125X83 (DRAPES) ×2 IMPLANT
DRAPE U-SHAPE 47X51 STRL (DRAPES) ×4 IMPLANT
DRESSING AQUACEL AG SP 3.5X10 (GAUZE/BANDAGES/DRESSINGS) ×1 IMPLANT
DRSG AQUACEL AG ADV 3.5X10 (GAUZE/BANDAGES/DRESSINGS) ×2 IMPLANT
DRSG AQUACEL AG SP 3.5X10 (GAUZE/BANDAGES/DRESSINGS) ×2
DURAPREP 26ML APPLICATOR (WOUND CARE) ×2 IMPLANT
ELECT REM PT RETURN 15FT ADLT (MISCELLANEOUS) ×2 IMPLANT
ELIMINATOR HOLE APEX DEPUY (Hips) ×2 IMPLANT
GLOVE BIOGEL M STRL SZ7.5 (GLOVE) IMPLANT
GLOVE BIOGEL PI IND STRL 7.5 (GLOVE) ×1 IMPLANT
GLOVE BIOGEL PI IND STRL 8.5 (GLOVE) ×1 IMPLANT
GLOVE BIOGEL PI INDICATOR 7.5 (GLOVE) ×1
GLOVE BIOGEL PI INDICATOR 8.5 (GLOVE) ×1
GLOVE ECLIPSE 8.0 STRL XLNG CF (GLOVE) ×4 IMPLANT
GLOVE ORTHO TXT STRL SZ7.5 (GLOVE) ×2 IMPLANT
GOWN STRL REUS W/TWL 2XL LVL3 (GOWN DISPOSABLE) ×2 IMPLANT
GOWN STRL REUS W/TWL LRG LVL3 (GOWN DISPOSABLE) ×2 IMPLANT
HEAD CERAMIC 36 PLUS5 (Hips) ×2 IMPLANT
HOLDER FOLEY CATH W/STRAP (MISCELLANEOUS) ×2 IMPLANT
LINER NEUTRAL 52X36MM PLUS 4 (Liner) ×2 IMPLANT
PACK ANTERIOR HIP CUSTOM (KITS) ×2 IMPLANT
SCREW 6.5MMX30MM (Screw) ×2 IMPLANT
STEM FEM SZ3 STD ACTIS (Stem) ×2 IMPLANT
SUT MNCRL AB 4-0 PS2 18 (SUTURE) ×2 IMPLANT
SUT STRATAFIX 0 PDS 27 VIOLET (SUTURE) ×2
SUT VIC AB 1 CT1 36 (SUTURE) ×6 IMPLANT
SUT VIC AB 2-0 CT1 27 (SUTURE) ×2
SUT VIC AB 2-0 CT1 TAPERPNT 27 (SUTURE) ×2 IMPLANT
SUTURE STRATFX 0 PDS 27 VIOLET (SUTURE) ×1 IMPLANT
TRAY FOLEY MTR SLVR 16FR STAT (SET/KITS/TRAYS/PACK) IMPLANT
WATER STERILE IRR 1000ML POUR (IV SOLUTION) ×2 IMPLANT
YANKAUER SUCT BULB TIP 10FT TU (MISCELLANEOUS) IMPLANT

## 2017-10-29 NOTE — Anesthesia Postprocedure Evaluation (Signed)
Anesthesia Post Note  Patient: Sherry Robinson  Procedure(s) Performed: LEFT TOTAL HIP ARTHROPLASTY ANTERIOR APPROACH (Left Hip)     Patient location during evaluation: PACU Anesthesia Type: Spinal Level of consciousness: oriented and awake and alert Pain management: pain level controlled Vital Signs Assessment: post-procedure vital signs reviewed and stable Respiratory status: spontaneous breathing, respiratory function stable and patient connected to nasal cannula oxygen Cardiovascular status: blood pressure returned to baseline and stable Postop Assessment: no headache, no backache and no apparent nausea or vomiting Anesthetic complications: no    Last Vitals:  Vitals:   10/29/17 1100 10/29/17 1105  BP: 94/60 (!) 101/53  Pulse: 60   Resp: 16   Temp:    SpO2: 93%     Last Pain:  Vitals:   10/29/17 1115  TempSrc:   PainSc: 3                  Airam Heidecker

## 2017-10-29 NOTE — Progress Notes (Signed)
[facility] RN notified pt will be in 1311 in 20 minutes

## 2017-10-29 NOTE — Op Note (Signed)
NAME:  Sherry Robinson [facility]                ACCOUNT NO.: [id number]      MEDICAL RECORD NO.: [id number]      FACILITY:  [facility]      PHYSICIAN:  Mauri Pole  DATE OF BIRTH:  October 01, 1950     DATE OF PROCEDURE:  10/29/2017                                 OPERATIVE REPORT         PREOPERATIVE DIAGNOSIS: Left  hip osteoarthritis.      POSTOPERATIVE DIAGNOSIS:  Left hip osteoarthritis.      PROCEDURE:  Left total hip replacement through an anterior approach   utilizing DePuy THR system, component size 25mm pinnacle cup, a size 36+4 neutral   Altrex liner, a size 3 standard Actis femoral stem with a 36+5 delta ceramic   ball.      SURGEON:  Pietro Cassis. Alvan Dame, M.D.      ASSISTANT:  Nehemiah Massed, PA-C     ANESTHESIA:  Spinal.      SPECIMENS:  None.      COMPLICATIONS:  None.      BLOOD LOSS:  300 cc     DRAINS:  None.      INDICATION OF THE PROCEDURE:  Sherry Robinson is a 67 y.o. female who had   presented to office for evaluation of right hip pain.  Radiographs revealed   progressive degenerative changes with bone-on-bone   articulation of the  hip joint, including subchondral cystic changes and osteophytes.  The patient had painful limited range of   motion significantly affecting their overall quality of life and function.  The patient was failing to    respond to conservative measures including medications and/or injections and activity modification and at this point was ready   to proceed with more definitive measures.  Consent was obtained for   benefit of pain relief.  Specific risks of infection, DVT, component   failure, dislocation, neurovascular injury, and need for revision surgery were reviewed in the office as well discussion of   the anterior versus posterior approach were reviewed.     PROCEDURE IN DETAIL:  The patient was brought to operative theater.   Once adequate anesthesia, preoperative antibiotics, 2 gm of Ancef, 1 gm of Tranexamic Acid,  and 10 mg of Decadron were administered, the patient was positioned supine on the [employer] table.  Once the patient was safely positioned with adequate padding of boney prominences we predraped out the hip, and used fluoroscopy to confirm orientation of the pelvis.      The left hip was then prepped and draped from proximal iliac crest to   mid thigh with a shower curtain technique.      Time-out was performed identifying the patient, planned procedure, and the appropriate extremity.     An incision was then made 2 cm lateral to the   anterior superior iliac spine extending over the orientation of the   tensor fascia lata muscle and sharp dissection was carried down to the   fascia of the muscle.      The fascia was then incised.  The muscle belly was identified and swept   laterally and retractor placed along the superior neck.  Following   cauterization of the circumflex vessels and removing some  pericapsular   fat, a second cobra retractor was placed on the inferior neck.  A T-capsulotomy was made along the line of the   superior neck to the trochanteric fossa, then extended proximally and   distally.  Tag sutures were placed and the retractors were then placed   intracapsular.  We then identified the trochanteric fossa and   orientation of my neck cut and then made a neck osteotomy with the femur on traction.  The femoral   head was removed without difficulty or complication.  Traction was let   off and retractors were placed posterior and anterior around the   acetabulum.      The labrum and foveal tissue were debrided.  Osteophytes excised.  I began reaming with a 46 mm   reamer and reamed up to 51 mm reamer with good bony bed preparation and a 52 mm  cup was chosen.  The final 52 mm Pinnacle cup was then impacted under fluoroscopy to confirm the depth of penetration and orientation with respect to   Abduction and forward flexion.  A screw was placed into the ilium followed by the  hole eliminator.  The final   36+4 neutral Altrex liner was impacted with good visualized rim fit.  The cup was positioned anatomically within the acetabular portion of the pelvis.      At this point, the femur was rolled to 100 degrees.  Further capsule was   released off the inferior aspect of the femoral neck.  I then   released the superior capsule proximally.  With the leg in a neutral position the hook was placed laterally   along the femur under the vastus lateralis origin and elevated manually and then held in position using the hook attachment on the bed.  The leg was then extended and adducted with the leg rolled to 100   degrees of external rotation.  Retractors were placed along the medial calcar and posteriorly over the greater trochanter.  Once the proximal femur was fully   exposed, I used a box osteotome to set orientation.  I then began   broaching with the starting chili pepper broach and passed this by hand and then broached up to 3.  With the 3 broach in place I chose a standard neck and did several trial reductions.  The offset was appropriate, leg lengths   appeared to be equal best matched with the +5 head ball trial confirmed radiographically.   Given these findings, I went ahead and dislocated the hip, repositioned all   retractors and positioned the right hip in the extended and abducted position.  The final 3 standard Actis femoral stem was   chosen and it was impacted down to the level of neck cut.  Based on this   and the trial reductions, a final 36+5 delta ceramic ball was chosen and   impacted onto a clean and dry trunnion, and the hip was reduced.  The   hip had been irrigated throughout the case again at this point.  I did   reapproximate the superior capsular leaflet to the anterior leaflet   using #1 Vicryl.  The fascia of the   tensor fascia lata muscle was then reapproximated using #1 Vicryl and #0 Stratafix sutures.  The   remaining wound was closed with  2-0 Vicryl and running 4-0 Monocryl.   The hip was cleaned, dried, and dressed sterilely using Dermabond and   Aquacel dressing.  The patient was then brought  to recovery room in stable condition tolerating the procedure well.    Nehemiah Massed, PA-C was present for the entirety of the case involved from   preoperative positioning, perioperative retractor management, general   facilitation of the case, as well as primary wound closure as assistant.            Pietro Cassis Alvan Dame, M.D.        10/29/2017 10:01 AM

## 2017-10-29 NOTE — Anesthesia Procedure Notes (Signed)
Spinal  Patient location during procedure: OR Start time: 10/29/2017 8:41 AM End time: 10/29/2017 8:44 AM Staffing Anesthesiologist: Janeece Riggers, MD Preanesthetic Checklist Completed: patient identified, site marked, surgical consent, pre-op evaluation, timeout performed, IV checked, risks and benefits discussed and monitors and equipment checked Spinal Block Patient position: sitting Prep: DuraPrep Patient monitoring: heart rate, cardiac monitor, continuous pulse ox and blood pressure Approach: midline Location: L2-3 Injection technique: single-shot Needle Needle type: Sprotte  Needle gauge: 24 G Needle length: 9 cm Assessment Sensory level: T4

## 2017-10-29 NOTE — Discharge Instructions (Signed)

## 2017-10-29 NOTE — Transfer of Care (Signed)
Immediate Anesthesia Transfer of Care Note  Patient: Sherry Robinson  Procedure(s) Performed: LEFT TOTAL HIP ARTHROPLASTY ANTERIOR APPROACH (Left Hip)  Patient Location: PACU  Anesthesia Type:Spinal  Level of Consciousness: awake, alert  and oriented  Airway & Oxygen Therapy: Patient Spontanous Breathing and Patient connected to face mask oxygen  Post-op Assessment: Report given to RN and Post -op Vital signs reviewed and stable  Post vital signs: Reviewed and stable  Last Vitals:  Vitals Value Taken Time  BP 95/63 10/29/2017 10:45 AM  Temp    Pulse 62 10/29/2017 10:49 AM  Resp 13 10/29/2017 10:49 AM  SpO2 91 % 10/29/2017 10:49 AM  Vitals shown include unvalidated device data.  Last Pain:  Vitals:   10/29/17 0656  TempSrc:   PainSc: 0-No pain      Patients Stated Pain Goal: 4 (49/86/51 6861)  Complications: No apparent anesthesia complications

## 2017-10-29 NOTE — Evaluation (Signed)
Physical Therapy Evaluation Patient Details Name: Sherry Robinson MRN: [id number] DOB: [birth year] Today's Date: 10/29/2017   History of Present Illness  pt s/p DA THA on Left on 10/29/2017.   Clinical Impression  Pt is s/p THA resulting in the deficits listed below (see PT Problem List).  Pt will benefit from acute PT to increase their independence and safety with mobility to allow discharge safely home alone. Family assessing how pt will progress while here so they can arrange help at home if she will need it. PT lives in [place] and dtrs live here in [place] and work full time.  Limited eval and ambulation this evening due to spinal block still not worn off during assessment.      Follow Up Recommendations Follow surgeon's recommendation for DC plan and follow-up therapies(per MD notes no PT f/u)    Equipment Recommendations  None recommended by PT    Recommendations for Other Services       Precautions / Restrictions Precautions Precautions: None Precaution Comments: direct anoterior hip replacement with no precuations  Restrictions LLE Weight Bearing: Weight bearing as tolerated      Mobility  Bed Mobility Overal bed mobility: Needs Assistance Bed Mobility: Supine to Sit;Sit to Supine     Supine to sit: Min assist     General bed mobility comments: cues for sequencing and Assisting LLE   Transfers Overall transfer level: Needs assistance Equipment used: Rolling walker (2 wheeled) Transfers: Sit to/from Stand Sit to Stand: Min assist;+2 safety/equipment         General transfer comment: cues for hand placement and hands on assistance and for safety due to block on worn off yet.   Ambulation/Gait Ambulation/Gait assistance: Min assist;+2 physical assistance Gait Distance (Feet): 5 Feet(limited distance due to spinal block not worn off yet ) Assistive device: Rolling walker (2 wheeled) Gait Pattern/deviations: Step-to pattern     General Gait Details: cues  for safety with step to sequence  Stairs            Wheelchair Mobility    Modified Rankin (Stroke Patients Only)       Balance Overall balance assessment: Needs assistance;Mild deficits observed, not formally tested Sitting-balance support: Feet supported Sitting balance-Leahy Scale: Good     Standing balance support: During functional activity Standing balance-Leahy Scale: Fair                               Pertinent Vitals/Pain Pain Assessment: 0-10 Pain Score: 3  Pain Location: L hip area. However still with numbness in some of gluteal area R and L , and L thigh area  Pain Descriptors / Indicators: Aching Pain Intervention(s): Monitored during session;Ice applied    Home Living Family/patient expects to be discharged to:: Private residence Living Arrangements: Alone Available Help at Discharge: Available PRN/intermittently(but all family works full time and states unable to take off. They stated they may be able to hire a caregiver to help for first week if able. ) Type of Home: House Home Access: Level entry     Home Layout: One level Home Equipment: [place] - 2 wheels;Bedside commode Additional Comments: pt lives in [place] and family lives in [place]. Family works in [employer].     Prior Function Level of Independence: Independent         Comments: " I just get tired real easily     Hand Dominance  Extremity/Trunk Assessment        Lower Extremity Assessment Lower Extremity Assessment: LLE deficits/detail LLE Deficits / Details: difficult to assess due to pt still with numbess from spinal in OR in L thigh area and BIL gluteal area        Communication   Communication: No difficulties  Cognition Arousal/Alertness: Awake/alert Behavior During Therapy: WFL for tasks assessed/performed Overall Cognitive Status: Within Functional Limits for tasks assessed                                         General Comments      Exercises     Assessment/Plan    PT Assessment Patient needs continued PT services  PT Problem List Decreased strength;Decreased activity tolerance;Decreased mobility;Decreased safety awareness       PT Treatment Interventions DME instruction;Gait training;Functional mobility training;Therapeutic activities;Therapeutic exercise;Patient/family education    PT Goals (Current goals can be found in the Care Plan section)  Acute Rehab PT Goals Patient Stated Goal: i want to be home and heal and be independent again  PT Goal Formulation: With patient/family Time For Goal Achievement: 11/12/17 Potential to Achieve Goals: Good    Frequency 7X/week   Barriers to discharge        Co-evaluation               AM-PAC PT "6 Clicks" Daily Activity  Outcome Measure Difficulty turning over in bed (including adjusting bedclothes, sheets and blankets)?: Unable Difficulty moving from lying on back to sitting on the side of the bed? : Unable Difficulty sitting down on and standing up from a chair with arms (e.g., wheelchair, bedside commode, etc,.)?: Unable Help needed moving to and from a bed to chair (including a wheelchair)?: A Little Help needed walking in hospital room?: Total Help needed climbing 3-5 steps with a railing? : Total 6 Click Score: 8    End of Session Equipment Utilized During Treatment: Gait belt Activity Tolerance: Patient tolerated treatment well;Treatment limited secondary to medical complications (Comment)(limited due to spinal still not worn off completely from surgery ) Patient left: in chair;with call bell/phone within reach;with chair alarm set Nurse Communication: Mobility status PT Visit Diagnosis: Other abnormalities of gait and mobility (R26.89)    Time: 9295-7473 PT Time Calculation (min) (ACUTE ONLY): 25 min   Charges:   PT Evaluation $PT Eval Low Complexity: 1 Low PT Treatments $Therapeutic Activity: 8-22 mins         Clide Dales, PT Pager: [phone] 10/29/2017   Sherry Robinson, Gatha Mayer 10/29/2017, 8:32 PM

## 2017-10-29 NOTE — Interval H&P Note (Signed)
History and Physical Interval Note:  10/29/2017 7:19 AM  Sherry Robinson  has presented today for surgery, with the diagnosis of Left hip osteoarthritis  The various methods of treatment have been discussed with the patient and family. After consideration of risks, benefits and other options for treatment, the patient has consented to  Procedure(s) with comments: LEFT TOTAL HIP ARTHROPLASTY ANTERIOR APPROACH (Left) - 70 mins as a surgical intervention .  The patient's history has been reviewed, patient examined, no change in status, stable for surgery.  I have reviewed the patient's chart and labs.  Questions were answered to the patient's satisfaction.     Mauri Pole

## 2017-10-30 ENCOUNTER — Encounter (HOSPITAL_COMMUNITY): Payer: Self-pay | Admitting: Orthopedic Surgery

## 2017-10-30 DIAGNOSIS — E669 Obesity, unspecified: Secondary | ICD-10-CM | POA: Diagnosis present

## 2017-10-30 LAB — BASIC METABOLIC PANEL
Anion gap: 7 (ref 5–15)
BUN: 19 mg/dL (ref 8–23)
CHLORIDE: 106 mmol/L (ref 98–111)
CO2: 27 mmol/L (ref 22–32)
Calcium: 9.7 mg/dL (ref 8.9–10.3)
Creatinine, Ser: 1.31 mg/dL — ABNORMAL HIGH (ref 0.44–1.00)
GFR calc Af Amer: 48 mL/min — ABNORMAL LOW (ref >60)
GFR calc non Af Amer: 41 mL/min — ABNORMAL LOW (ref >60)
Glucose, Bld: 139 mg/dL — ABNORMAL HIGH (ref 70–99)
POTASSIUM: 4.4 mmol/L (ref 3.5–5.1)
SODIUM: 140 mmol/L (ref 135–145)

## 2017-10-30 LAB — CBC
HEMATOCRIT: 30 % — AB (ref 36.0–46.0)
HEMOGLOBIN: 10.3 g/dL — AB (ref 12.0–15.0)
MCH: 30.8 pg (ref 26.0–34.0)
MCHC: 34.3 g/dL (ref 30.0–36.0)
MCV: 89.8 fL (ref 78.0–100.0)
Platelets: 121 10*3/uL — ABNORMAL LOW (ref 150–400)
RBC: 3.34 MIL/uL — AB (ref 3.87–5.11)
RDW: 13.2 % (ref 11.5–15.5)
WBC: 10.1 10*3/uL (ref 4.0–10.5)

## 2017-10-30 NOTE — Progress Notes (Signed)
Physical Therapy Treatment Patient Details Name: LAWREN SEXSON MRN: [id number] DOB: 12-09-1950 Today's Date: 10/30/2017    History of Present Illness pt s/p DA THA on Left on 10/29/2017.     PT Comments    Pt assisted with ambulating in hallway and performed LE exercises.  Pt reports plan for d/c home tomorrow.   Follow Up Recommendations  Follow surgeon's recommendation for DC plan and follow-up therapies(no f/u PT planned)     Equipment Recommendations  None recommended by PT    Recommendations for Other Services       Precautions / Restrictions Precautions Precautions: None Restrictions LLE Weight Bearing: Weight bearing as tolerated    Mobility  Bed Mobility               General bed mobility comments: pt up in recliner on arrival  Transfers Overall transfer level: Needs assistance Equipment used: Rolling walker (2 wheeled) Transfers: Sit to/from Stand Sit to Stand: Min guard         General transfer comment: verbal cues for UE and LE positioning  Ambulation/Gait Ambulation/Gait assistance: Min guard Gait Distance (Feet): 100 Feet Assistive device: Rolling walker (2 wheeled) Gait Pattern/deviations: Step-to pattern;Decreased stance time - left     General Gait Details: cues for sequence, step length, RW positioning   Stairs             Wheelchair Mobility    Modified Rankin (Stroke Patients Only)       Balance                                            Cognition Arousal/Alertness: Awake/alert Behavior During Therapy: WFL for tasks assessed/performed Overall Cognitive Status: Within Functional Limits for tasks assessed                                        Exercises Total Joint Exercises Ankle Circles/Pumps: AROM;10 reps;Both Quad Sets: AROM;10 reps;Both Short Arc Quad: AROM;10 reps;Left Heel Slides: AAROM;10 reps;Left Hip ABduction/ADduction: Left;AAROM;10 reps Long Arc Quad: AROM;10  reps;Left;Seated    General Comments        Pertinent Vitals/Pain Pain Assessment: 0-10 Pain Score: 3  Pain Location: L hip area Pain Descriptors / Indicators: Sore Pain Intervention(s): Repositioned;Monitored during session;Limited activity within patient's tolerance    Home Living                      Prior Function            PT Goals (current goals can now be found in the care plan section) Progress towards PT goals: Progressing toward goals    Frequency    7X/week      PT Plan Current plan remains appropriate    Co-evaluation              AM-PAC PT "6 Clicks" Daily Activity  Outcome Measure  Difficulty turning over in bed (including adjusting bedclothes, sheets and blankets)?: A Lot Difficulty moving from lying on back to sitting on the side of the bed? : A Lot Difficulty sitting down on and standing up from a chair with arms (e.g., wheelchair, bedside commode, etc,.)?: A Lot Help needed moving to and from a bed to chair (including a wheelchair)?: A Little Help needed walking  in hospital room?: A Little Help needed climbing 3-5 steps with a railing? : A Little 6 Click Score: 15    End of Session Equipment Utilized During Treatment: Gait belt Activity Tolerance: Patient tolerated treatment well Patient left: in chair;with call bell/phone within reach   PT Visit Diagnosis: Other abnormalities of gait and mobility (R26.89)     Time: 4540-9811 PT Time Calculation (min) (ACUTE ONLY): 25 min  Charges:  $Gait Training: 8-22 mins $Therapeutic Exercise: 8-22 mins                     Carmelia Bake, PT, DPT 10/30/2017 Pager: [phone]  York Ram E 10/30/2017, 1:40 PM

## 2017-10-30 NOTE — Progress Notes (Signed)
Physical Therapy Treatment Patient Details Name: Sherry Robinson MRN: [id number] DOB: 1950-06-21 Today's Date: 10/30/2017    History of Present Illness pt s/p DA THA on Left on 10/29/2017.     PT Comments    Pt ambulated in hallway again this afternoon.  Pt's mobility is improving, and she tolerated greater distance this afternoon.   Follow Up Recommendations  Follow surgeon's recommendation for DC plan and follow-up therapies     Equipment Recommendations  None recommended by PT    Recommendations for Other Services       Precautions / Restrictions Precautions Precautions: None Restrictions LLE Weight Bearing: Weight bearing as tolerated    Mobility  Bed Mobility               General bed mobility comments: pt up in recliner on arrival  Transfers Overall transfer level: Needs assistance Equipment used: Rolling walker (2 wheeled) Transfers: Sit to/from Stand Sit to Stand: Min guard         General transfer comment: verbal cues for UE and LE positioning, increased effort  Ambulation/Gait Ambulation/Gait assistance: Min guard Gait Distance (Feet): 160 Feet Assistive device: Rolling walker (2 wheeled) Gait Pattern/deviations: Step-to pattern;Decreased stance time - left Gait velocity: decr   General Gait Details: cues for sequence, step length, RW positioning   Stairs             Wheelchair Mobility    Modified Rankin (Stroke Patients Only)       Balance                                            Cognition Arousal/Alertness: Awake/alert Behavior During Therapy: WFL for tasks assessed/performed Overall Cognitive Status: Within Functional Limits for tasks assessed                                        Exercises    General Comments        Pertinent Vitals/Pain Pain Assessment: 0-10 Pain Score: 5  Pain Location: L hip area Pain Descriptors / Indicators: Sore;Discomfort Pain Intervention(s):  Limited activity within patient's tolerance;Monitored during session;Repositioned    Home Living                      Prior Function            PT Goals (current goals can now be found in the care plan section) Progress towards PT goals: Progressing toward goals    Frequency    7X/week      PT Plan Current plan remains appropriate    Co-evaluation              AM-PAC PT "6 Clicks" Daily Activity  Outcome Measure  Difficulty turning over in bed (including adjusting bedclothes, sheets and blankets)?: A Little Difficulty moving from lying on back to sitting on the side of the bed? : A Lot Difficulty sitting down on and standing up from a chair with arms (e.g., wheelchair, bedside commode, etc,.)?: A Lot Help needed moving to and from a bed to chair (including a wheelchair)?: A Little Help needed walking in hospital room?: A Little Help needed climbing 3-5 steps with a railing? : A Little 6 Click Score: 16    End of Session Equipment Utilized  During Treatment: Gait belt Activity Tolerance: Patient tolerated treatment well Patient left: in chair;with call bell/phone within reach   PT Visit Diagnosis: Other abnormalities of gait and mobility (R26.89)     Time: 0814-4818 PT Time Calculation (min) (ACUTE ONLY): 15 min  Charges:  $Gait Training: 8-22 mins                     Carmelia Bake, PT, DPT 10/30/2017 Pager: [phone]  York Ram E 10/30/2017, 4:07 PM

## 2017-10-30 NOTE — Progress Notes (Signed)
     Subjective: 1 Day Post-Op Procedure(s) (LRB): LEFT TOTAL HIP ARTHROPLASTY ANTERIOR APPROACH (Left)    Seen by Dr. Alvan Dame. Patient reports pain as mild, controlled at this time with medication. No reported events throughout the night.   Plan for discharge tomorrow due to underlying medical co-morbidities, pain control and need for inpatient therapy to meet goal of being discharged home safely with family/caregiver.  Past Medical History:  Diagnosis Date  . Arthritis   . Chronic kidney disease (CKD), stage III (moderate) (HCC)   . Hypercholesterolemia with hypertriglyceridemia   . Hypertension   . Metabolic syndrome   . Morbid obesity ([place])       Objective:   VITALS:   Vitals:   10/30/17 0110 10/30/17 0509  BP: 119/71 114/60  Pulse: 77 85  Resp: 14 15  Temp: 98.4 F (36.9 C) 99.4 F (37.4 C)  SpO2: 100% 100%    Dorsiflexion/Plantar flexion intact Incision: dressing C/D/I No cellulitis present Compartment soft  LABS Recent Labs    10/30/17 0532  HGB 10.3*  HCT 30.0*  WBC 10.1  PLT 121*    Recent Labs    10/29/17 0624 10/30/17 0532  NA 141 140  K 3.9 4.4  BUN 24* 19  CREATININE 1.39* 1.31*  GLUCOSE 103* 139*     Assessment/Plan: 1 Day Post-Op Procedure(s) (LRB): LEFT TOTAL HIP ARTHROPLASTY ANTERIOR APPROACH (Left) Foley cath d/c'ed Advance diet Up with therapy D/C IV fluids Discharge home eventually when ready, possibly tomorrow  Obese (BMI 30-39.9) Estimated body mass index is 37.61 kg/m as calculated from the following:   Height as of this encounter: 5\' 6"  (1.676 m).   Weight as of this encounter: 105.7 kg (233 lb). Patient also counseled that weight may inhibit the healing process Patient counseled that losing weight will help with future health issues      [place]. Raisa Ditto   PAC  10/30/2017, 8:18 AM

## 2017-10-31 LAB — CBC
HCT: 28.5 % — ABNORMAL LOW (ref 36.0–46.0)
Hemoglobin: 9.9 g/dL — ABNORMAL LOW (ref 12.0–15.0)
MCH: 31.1 pg (ref 26.0–34.0)
MCHC: 34.7 g/dL (ref 30.0–36.0)
MCV: 89.6 fL (ref 78.0–100.0)
PLATELETS: 118 10*3/uL — AB (ref 150–400)
RBC: 3.18 MIL/uL — ABNORMAL LOW (ref 3.87–5.11)
RDW: 13.4 % (ref 11.5–15.5)
WBC: 10.3 10*3/uL (ref 4.0–10.5)

## 2017-10-31 LAB — BASIC METABOLIC PANEL
ANION GAP: 6 (ref 5–15)
BUN: 22 mg/dL (ref 8–23)
CO2: 28 mmol/L (ref 22–32)
Calcium: 9.7 mg/dL (ref 8.9–10.3)
Chloride: 108 mmol/L (ref 98–111)
Creatinine, Ser: 1.19 mg/dL — ABNORMAL HIGH (ref 0.44–1.00)
GFR calc Af Amer: 54 mL/min — ABNORMAL LOW (ref >60)
GFR, EST NON AFRICAN AMERICAN: 46 mL/min — AB (ref >60)
GLUCOSE: 118 mg/dL — AB (ref 70–99)
Potassium: 4.5 mmol/L (ref 3.5–5.1)
Sodium: 142 mmol/L (ref 135–145)

## 2017-10-31 NOTE — Progress Notes (Signed)
Patient discharged to home w/ family. Given all belongings, instructions, prescriptions. Verbalized understanding of instructions. Escorted to pov via w/c. 

## 2017-10-31 NOTE — Progress Notes (Signed)
Patient ID: Sherry Robinson, female   DOB: Jul 26, 1950, 67 y.o.   MRN: [id number] Subjective: 2 Days Post-Op Procedure(s) (LRB): LEFT TOTAL HIP ARTHROPLASTY ANTERIOR APPROACH (Left)    Patient reports pain as mild.  Doing much better after therapy yesterday, ready to be discharged home. No events  Objective:   VITALS:   Vitals:   10/30/17 2112 10/31/17 0521  BP: 110/84 114/78  Pulse: 70 (!) 58  Resp: 16 16  Temp: 98.4 F (36.9 C) 98.6 F (37 C)  SpO2: 97% 99%    Neurovascular intact Incision: dressing C/D/I  LABS Recent Labs    10/30/17 0532 10/31/17 0518  HGB 10.3* 9.9*  HCT 30.0* 28.5*  WBC 10.1 10.3  PLT 121* 118*    Recent Labs    10/29/17 0624 10/30/17 0532 10/31/17 0518  NA 141 140 142  K 3.9 4.4 4.5  BUN 24* 19 22  CREATININE 1.39* 1.31* 1.19*  GLUCOSE 103* 139* 118*    No results for input(s): LABPT, INR in the last 72 hours.   Assessment/Plan: 2 Days Post-Op Procedure(s) (LRB): LEFT TOTAL HIP ARTHROPLASTY ANTERIOR APPROACH (Left)   Up with therapy  Home today after am therapy RTC in 2 weeks Reviewed goals and expectations

## 2017-10-31 NOTE — Progress Notes (Signed)
Physical Therapy Treatment Patient Details Name: Sherry Robinson MRN: [id number] DOB: [birth year] Today's Date: 10/31/2017    History of Present Illness pt s/p DA THA on Left on 10/29/2017.     PT Comments    Pt ambulated in hallway and tolerated distance well.  Pt reports no steps or stairs at home.  Pt performed standing LE exercises and provided HEP.  Verbally reviewed HEP as pt has no plans for f/u PT upon d/c.  Pt had no further questions and feels ready for d/c home today.   Follow Up Recommendations  Follow surgeon's recommendation for DC plan and follow-up therapies     Equipment Recommendations  None recommended by PT    Recommendations for Other Services       Precautions / Restrictions Precautions Precautions: None Restrictions LLE Weight Bearing: Weight bearing as tolerated    Mobility  Bed Mobility Overal bed mobility: Needs Assistance Bed Mobility: Supine to Sit     Supine to sit: HOB elevated;Supervision        Transfers Overall transfer level: Needs assistance Equipment used: Rolling walker (2 wheeled) Transfers: Sit to/from Stand Sit to Stand: Supervision         General transfer comment: verbal cues for UE and LE positioning  Ambulation/Gait Ambulation/Gait assistance: Min guard;Supervision Gait Distance (Feet): 180 Feet Assistive device: Rolling walker (2 wheeled) Gait Pattern/deviations: Decreased stance time - left;Step-through pattern;Antalgic Gait velocity: decr   General Gait Details: cues for sequence, step length, RW positioning   Stairs             Wheelchair Mobility    Modified Rankin (Stroke Patients Only)       Balance                                            Cognition Arousal/Alertness: Awake/alert Behavior During Therapy: WFL for tasks assessed/performed Overall Cognitive Status: Within Functional Limits for tasks assessed                                         Exercises Total Joint Exercises Ankle Circles/Pumps: AROM;10 reps;Both Hip ABduction/ADduction: Left;10 reps;AROM;Standing Long Arc Quad: AROM;10 reps;Left;Seated Knee Flexion: Standing;Left;AROM;10 reps Marching in Standing: AROM;10 reps;Left;Standing Standing Hip Extension: Standing;Left;10 reps;AROM    General Comments        Pertinent Vitals/Pain Pain Assessment: 0-10 Pain Score: 2  Pain Descriptors / Indicators: Sore;Discomfort Pain Intervention(s): Limited activity within patient's tolerance;Repositioned;Monitored during session    Home Living                      Prior Function            PT Goals (current goals can now be found in the care plan section) Progress towards PT goals: Progressing toward goals    Frequency    7X/week      PT Plan Current plan remains appropriate    Co-evaluation              AM-PAC PT "6 Clicks" Daily Activity  Outcome Measure  Difficulty turning over in bed (including adjusting bedclothes, sheets and blankets)?: A Little Difficulty moving from lying on back to sitting on the side of the bed? : A Little Difficulty sitting down on and standing up from  a chair with arms (e.g., wheelchair, bedside commode, etc,.)?: A Little Help needed moving to and from a bed to chair (including a wheelchair)?: A Little Help needed walking in hospital room?: A Little Help needed climbing 3-5 steps with a railing? : A Little 6 Click Score: 18    End of Session   Activity Tolerance: Patient tolerated treatment well Patient left: in chair;with call bell/phone within reach;with nursing/sitter in room   PT Visit Diagnosis: Other abnormalities of gait and mobility (R26.89)     Time: 4458-4835 PT Time Calculation (min) (ACUTE ONLY): 16 min  Charges:  $Therapeutic Exercise: 8-22 mins                     Carmelia Bake, PT, DPT 10/31/2017 Pager: [phone]  York Ram E 10/31/2017, 12:43 PM

## 2017-11-04 NOTE — Discharge Summary (Signed)
Physician Discharge Summary  Patient ID: Sherry Robinson MRN: [id number] DOB/AGE: 1951/02/16 67 y.o.  Admit date: 10/29/2017 Discharge date: 10/31/2017   Procedures:  Procedure(s) (LRB): LEFT TOTAL HIP ARTHROPLASTY ANTERIOR APPROACH (Left)  Attending Physician:  Dr. Paralee Cancel   Admission Diagnoses:   Left hip primary OA / pain  Discharge Diagnoses:  Principal Problem:   S/P left THA, AA Active Problems:   Obese  Past Medical History:  Diagnosis Date  . Arthritis   . Chronic kidney disease (CKD), stage III (moderate) (HCC)   . Hypercholesterolemia with hypertriglyceridemia   . Hypertension   . Metabolic syndrome   . Morbid obesity (HCC)     HPI:    Sherry Robinson, 67 y.o. female, has a history of pain and functional disability in the left hip(s) due to arthritis and patient has failed non-surgical conservative treatments for greater than 12 weeks to include NSAID's and/or analgesics, use of assistive devices and activity modification.  Onset of symptoms was gradual starting ~1 years ago with gradually worsening course since that time.The patient noted no past surgery on the left hip(s).  Patient currently rates pain in the left hip at 10 out of 10 with activity. Patient has worsening of pain with activity and weight bearing, trendelenberg gait, pain that interfers with activities of daily living and pain with passive range of motion. Patient has evidence of periarticular osteophytes and joint space narrowing by imaging studies. This condition presents safety issues increasing the risk of falls. There is no current active infection.  Risks, benefits and expectations were discussed with the patient.  Risks including but not limited to the risk of anesthesia, blood clots, nerve damage, blood vessel damage, failure of the prosthesis, infection and up to and including death.  Patient understand the risks, benefits and expectations and wishes to proceed with surgery.   PCP: [facility]   Discharged Condition: good  Hospital Course:  Patient underwent the above stated procedure on 10/29/2017. Patient tolerated the procedure well and brought to the recovery room in good condition and subsequently to the floor.  POD #1 BP: 114/60 ; Pulse: 85 ; Temp: 99.4 F (37.4 C) ; Resp: 15 Patient reports pain as mild, controlled at this time with medication. No reported events throughout the night.  Plan for discharge tomorrowdue to underlying medical co-morbidities, pain control and need for inpatient therapy to meet goal of being discharged home safely with family/caregiver.  LABS  Basename    HGB     10.3  HCT     30.0   POD #2  BP: 114/78 ; Pulse: 58 ; Temp: 98.6 F (37 C) ; Resp: 16 Patient reports pain as mild.  Doing much better after therapy yesterday, ready to be discharged home. No events. Neurovascular intact and incision: dressing C/D/I.   LABS  Basename    HGB     9.9  HCT     28.5    Discharge Exam: General appearance: alert, cooperative and no distress Extremities: Homans sign is negative, no sign of DVT, no edema, redness or tenderness in the calves or thighs and no ulcers, gangrene or trophic changes  Disposition: Home with follow up in 2 weeks   Follow-up Information    Paralee Cancel, MD. Schedule an appointment as soon as possible for a visit in 2 weeks.   Specialty:  Orthopedic Surgery Contact information: [address] [place] [address] [phone]  Discharge Instructions    Call MD / Call 911   Complete by:  As directed    If you experience chest pain or shortness of breath, CALL 911 and be transported to the hospital emergency room.  If you develope a fever above 101 F, pus (white drainage) or increased drainage or redness at the wound, or calf pain, call your surgeon's office.   Change dressing   Complete by:  As directed    Maintain surgical dressing until follow up in the clinic. If the  edges start to pull up, may reinforce with tape. If the dressing is no longer working, may remove and cover with gauze and tape, but must keep the area dry and clean.  Call with any questions or concerns.   Constipation Prevention   Complete by:  As directed    Drink plenty of fluids.  Prune juice may be helpful.  You may use a stool softener, such as Colace (over the counter) 100 mg twice a day.  Use MiraLax (over the counter) for constipation as needed.   Diet - low sodium heart healthy   Complete by:  As directed    Discharge instructions   Complete by:  As directed    Maintain surgical dressing until follow up in the clinic. If the edges start to pull up, may reinforce with tape. If the dressing is no longer working, may remove and cover with gauze and tape, but must keep the area dry and clean.  Follow up in 2 weeks at [facility]. Call with any questions or concerns.   Increase activity slowly as tolerated   Complete by:  As directed    Weight bearing as tolerated with assist device (walker, cane, etc) as directed, use it as long as suggested by your surgeon or therapist, typically at least 4-6 weeks.   TED hose   Complete by:  As directed    Use stockings (TED hose) for 2 weeks on both leg(s).  You may remove them at night for sleeping.      Allergies as of 10/31/2017      Reactions   Lisinopril Cough      Medication List    STOP taking these medications   acetaminophen 500 MG tablet Commonly known as:  TYLENOL     TAKE these medications   aspirin 81 MG chewable tablet Chew 1 tablet (81 mg total) by mouth 2 (two) times daily. Take for 4 weeks, then resume regular dose.   cholecalciferol 1000 units tablet Commonly known as:  VITAMIN D Take 1,000 Units by mouth daily.   docusate sodium 100 MG capsule Commonly known as:  COLACE Take 1 capsule (100 mg total) by mouth 2 (two) times daily.   ferrous sulfate 325 (65 FE) MG tablet Take 1 tablet (325 mg total) by  mouth 3 (three) times daily with meals.   HYDROcodone-acetaminophen 7.5-325 MG tablet Commonly known as:  NORCO Take 1-2 tablets by mouth every 4 (four) hours as needed for moderate pain.   losartan-hydrochlorothiazide 100-12.5 MG tablet Commonly known as:  HYZAAR Take 1 tablet by mouth daily.   methocarbamol 500 MG tablet Commonly known as:  ROBAXIN Take 1 tablet (500 mg total) by mouth every 6 (six) hours as needed for muscle spasms.   polyethylene glycol packet Commonly known as:  MIRALAX / GLYCOLAX Take 17 g by mouth 2 (two) times daily.   rosuvastatin 20 MG tablet Commonly known as:  CRESTOR Take 20 mg by mouth  daily.            Discharge Care Instructions  (From admission, onward)         Start     Ordered   10/30/17 0000  Change dressing    Comments:  Maintain surgical dressing until follow up in the clinic. If the edges start to pull up, may reinforce with tape. If the dressing is no longer working, may remove and cover with gauze and tape, but must keep the area dry and clean.  Call with any questions or concerns.   10/30/17 1959           Signed: [place] Pugh. Marysa Wessner   PA-C  11/04/2017, 7:52 AM

## 2017-12-13 DIAGNOSIS — M25552 Pain in left hip: Secondary | ICD-10-CM | POA: Diagnosis not present

## 2017-12-13 DIAGNOSIS — Z5189 Encounter for other specified aftercare: Secondary | ICD-10-CM | POA: Diagnosis not present

## 2018-01-02 ENCOUNTER — Other Ambulatory Visit (HOSPITAL_COMMUNITY): Payer: Self-pay | Admitting: *Deleted

## 2018-01-02 NOTE — Patient Instructions (Addendum)
Sherry Robinson  01/02/2018   Your procedure is scheduled on: 01-14-18  Report to [facility] Main  Entrance  Report to admitting at 900 AM    Call this number if you have problems the morning of surgery [phone]   Remember: Do not eat food or drink liquids :After Midnight. BRUSH YOUR TEETH MORNING OF SURGERY AND RINSE YOUR MOUTH OUT, NO CHEWING GUM CANDY OR MINTS.     Take these medicines the morning of surgery with A SIP OF WATER: ROSUVASTATIN (CRESTOR), TYLENOL ES IF NEEDED                               You may not have any metal on your body including hair pins and              piercings  Do not wear jewelry, make-up, lotions, powders or perfumes, deodorant             Do not wear nail polish.  Do not shave  48 hours prior to surgery.              Men may shave face and neck.   Do not bring valuables to the hospital. [place].  Contacts, dentures or bridgework may not be worn into surgery.  Leave suitcase in the car. After surgery it may be brought to your room.                  Please read over the following fact sheets you were given: _____________________________________________________________________             [facility] - Preparing for Surgery Before surgery, you can play an important role.  Because skin is not sterile, your skin needs to be as free of germs as possible.  You can reduce the number of germs on your skin by washing with CHG (chlorahexidine gluconate) soap before surgery.  CHG is an antiseptic cleaner which kills germs and bonds with the skin to continue killing germs even after washing. Please DO NOT use if you have an allergy to CHG or antibacterial soaps.  If your skin becomes reddened/irritated stop using the CHG and inform your nurse when you arrive at Short Stay. Do not shave (including legs and underarms) for at least 48 hours prior to the first CHG shower.  You may  shave your face/neck. Please follow these instructions carefully:  1.  Shower with CHG Soap the night before surgery and the  morning of Surgery.  2.  If you choose to wash your hair, wash your hair first as usual with your  normal  shampoo.  3.  After you shampoo, rinse your hair and body thoroughly to remove the  shampoo.                           4.  Use CHG as you would any other liquid soap.  You can apply chg directly  to the skin and wash                       Gently with a scrungie or clean washcloth.  5.  Apply the CHG Soap to your body ONLY  FROM THE NECK DOWN.   Do not use on face/ open                           Wound or open sores. Avoid contact with eyes, ears mouth and genitals (private parts).                       Wash face,  Genitals (private parts) with your normal soap.             6.  Wash thoroughly, paying special attention to the area where your surgery  will be performed.  7.  Thoroughly rinse your body with warm water from the neck down.  8.  DO NOT shower/wash with your normal soap after using and rinsing off  the CHG Soap.                9.  Pat yourself dry with a clean towel.            10.  Wear clean pajamas.            11.  Place clean sheets on your bed the night of your first shower and do not  sleep with pets. Day of Surgery : Do not apply any lotions/deodorants the morning of surgery.  Please wear clean clothes to the hospital/surgery center.  FAILURE TO FOLLOW THESE INSTRUCTIONS MAY RESULT IN THE CANCELLATION OF YOUR SURGERY PATIENT SIGNATURE_________________________________  NURSE SIGNATURE__________________________________  ________________________________________________________________________   Sherry Robinson  An incentive spirometer is a tool that can help keep your lungs clear and active. This tool measures how well you are filling your lungs with each breath. Taking long deep breaths may help reverse or decrease the chance of developing  breathing (pulmonary) problems (especially infection) following:  A long period of time when you are unable to move or be active. BEFORE THE PROCEDURE   If the spirometer includes an indicator to show your best effort, your nurse or respiratory therapist will set it to a desired goal.  If possible, sit up straight or lean slightly forward. Try not to slouch.  Hold the incentive spirometer in an upright position. INSTRUCTIONS FOR USE  1. Sit on the edge of your bed if possible, or sit up as far as you can in bed or on a chair. 2. Hold the incentive spirometer in an upright position. 3. Breathe out normally. 4. Place the mouthpiece in your mouth and seal your lips tightly around it. 5. Breathe in slowly and as deeply as possible, raising the piston or the ball toward the top of the column. 6. Hold your breath for 3-5 seconds or for as long as possible. Allow the piston or ball to fall to the bottom of the column. 7. Remove the mouthpiece from your mouth and breathe out normally. 8. Rest for a few seconds and repeat Steps 1 through 7 at least 10 times every 1-2 hours when you are awake. Take your time and take a few normal breaths between deep breaths. 9. The spirometer may include an indicator to show your best effort. Use the indicator as a goal to work toward during each repetition. 10. After each set of 10 deep breaths, practice coughing to be sure your lungs are clear. If you have an incision (the cut made at the time of surgery), support your incision when coughing by placing a pillow or rolled up towels firmly against it. Once you  are able to get out of bed, walk around indoors and cough well. You may stop using the incentive spirometer when instructed by your caregiver.  RISKS AND COMPLICATIONS  Take your time so you do not get dizzy or light-headed.  If you are in pain, you may need to take or ask for pain medication before doing incentive spirometry. It is harder to take a deep  breath if you are having pain. AFTER USE  Rest and breathe slowly and easily.  It can be helpful to keep track of a log of your progress. Your caregiver can provide you with a simple table to help with this. If you are using the spirometer at home, follow these instructions: [place] IF:   You are having difficultly using the spirometer.  You have trouble using the spirometer as often as instructed.  Your pain medication is not giving enough relief while using the spirometer.  You develop fever of 100.5 F (38.1 C) or higher. SEEK IMMEDIATE MEDICAL CARE IF:   You cough up bloody sputum that had not been present before.  You develop fever of 102 F (38.9 C) or greater.  You develop worsening pain at or near the incision site. MAKE SURE YOU:   Understand these instructions.  Will watch your condition.  Will get help right away if you are not doing well or get worse. Document Released: 07/23/2006 Document Revised: 06/04/2011 Document Reviewed: 09/23/2006 Exit[employer] Patient Information 2014 [employer], [place].   ________________________________________________________________________  WHAT IS A BLOOD TRANSFUSION? Blood Transfusion Information  A transfusion is the replacement of blood or some of its parts. Blood is made up of multiple cells which provide different functions.  Red blood cells carry oxygen and are used for blood loss replacement.  White blood cells fight against infection.  Platelets control bleeding.  Plasma helps clot blood.  Other blood products are available for specialized needs, such as hemophilia or other clotting disorders. BEFORE THE TRANSFUSION  Who gives blood for transfusions?   Healthy volunteers who are fully evaluated to make sure their blood is safe. This is blood bank blood. Transfusion therapy is the safest it has ever been in the practice of medicine. Before blood is taken from a donor, a complete history is taken to make sure  that person has no history of diseases nor engages in risky social behavior (examples are intravenous drug use or sexual activity with multiple partners). The donor's travel history is screened to minimize risk of transmitting infections, such as malaria. The donated blood is tested for signs of infectious diseases, such as HIV and hepatitis. The blood is then tested to be sure it is compatible with you in order to minimize the chance of a transfusion reaction. If you or a relative donates blood, this is often done in anticipation of surgery and is not appropriate for emergency situations. It takes many days to process the donated blood. RISKS AND COMPLICATIONS Although transfusion therapy is very safe and saves many lives, the main dangers of transfusion include:   Getting an infectious disease.  Developing a transfusion reaction. This is an allergic reaction to something in the blood you were given. Every precaution is taken to prevent this. The decision to have a blood transfusion has been considered carefully by your caregiver before blood is given. Blood is not given unless the benefits outweigh the risks. AFTER THE TRANSFUSION  Right after receiving a blood transfusion, you will usually feel much better and more energetic. This is  especially true if your red blood cells have gotten low (anemic). The transfusion raises the level of the red blood cells which carry oxygen, and this usually causes an energy increase.  The nurse administering the transfusion will monitor you carefully for complications. HOME CARE INSTRUCTIONS  No special instructions are needed after a transfusion. You may find your energy is better. Speak with your caregiver about any limitations on activity for underlying diseases you may have. SEEK MEDICAL CARE IF:   Your condition is not improving after your transfusion.  You develop redness or irritation at the intravenous (IV) site. SEEK IMMEDIATE MEDICAL CARE IF:  Any of  the following symptoms occur over the next 12 hours:  Shaking chills.  You have a temperature by mouth above 102 F (38.9 C), not controlled by medicine.  Chest, back, or muscle pain.  People around you feel you are not acting correctly or are confused.  Shortness of breath or difficulty breathing.  Dizziness and fainting.  You get a rash or develop hives.  You have a decrease in urine output.  Your urine turns a dark color or changes to pink, red, or brown. Any of the following symptoms occur over the next 10 days:  You have a temperature by mouth above 102 F (38.9 C), not controlled by medicine.  Shortness of breath.  Weakness after normal activity.  The white part of the eye turns yellow (jaundice).  You have a decrease in the amount of urine or are urinating less often.  Your urine turns a dark color or changes to pink, red, or brown. Document Released: 03/09/2000 Document Revised: 06/04/2011 Document Reviewed: 10/27/2007 [facility] Patient Information 2014 [place], [place].  _______________________________________________________________________

## 2018-01-06 NOTE — H&P (Signed)
TOTAL HIP ADMISSION H&P  Patient is admitted for right total hip arthroplasty, anterior approach.  Subjective:  Chief Complaint:   Right hip primary OA / pain  HPI: Sherry Robinson, 66 y.o. female, has a history of pain and functional disability in the right hip(s) due to arthritis and patient has failed non-surgical conservative treatments for greater than 12 weeks to include NSAID's and/or analgesics and activity modification.  Onset of symptoms was gradual starting 2+ years ago with gradually worsening course since that time.The patient noted prior procedures of the hip to include arthroplasty on the left hip 12/29/17.  Patient currently rates pain in the right hip at 8 out of 10 with activity. Patient has worsening of pain with activity and weight bearing, trendelenberg gait, pain that interfers with activities of daily living and pain with passive range of motion. Patient has evidence of periarticular osteophytes and joint space narrowing by imaging studies. This condition presents safety issues increasing the risk of falls.  There is no current active infection.  Risks, benefits and expectations were discussed with the patient.  Risks including but not limited to the risk of anesthesia, blood clots, nerve damage, blood vessel damage, failure of the prosthesis, infection and up to and including death.  Patient understand the risks, benefits and expectations and wishes to proceed with surgery.   PCP: [facility]  D/C Plans:       Home  Post-op Meds:       No Rx given   Tranexamic Acid:      To be given - IV   Decadron:      Is to be given  FYI:      ASA  [place]  DME:   Pt already has equipment  PT:   No PT    Patient Active Problem List   Diagnosis Date Noted  . Obese 10/30/2017  . S/P left THA, AA 10/29/2017   Past Medical History:  Diagnosis Date  . Arthritis   . Chronic kidney disease (CKD), stage III (moderate) (HCC)   . Hypercholesterolemia with  hypertriglyceridemia   . Hypertension   . Metabolic syndrome   . Morbid obesity ([place])     Past Surgical History:  Procedure Laterality Date  . BRAIN SURGERY  01/2015   pituitary adenoma removal   . TOTAL HIP ARTHROPLASTY Left 10/29/2017   Procedure: LEFT TOTAL HIP ARTHROPLASTY ANTERIOR APPROACH;  Surgeon: Paralee Cancel, MD;  Location: [facility];  Service: Orthopedics;  Laterality: Left;  70 mins    No current facility-administered medications for this encounter.    Current Outpatient Medications  Medication Sig Dispense Refill Last Dose  . cholecalciferol (VITAMIN D) 1000 units tablet Take 1,000 Units by mouth daily.   10/28/2017 at Unknown time  . ibuprofen (ADVIL,MOTRIN) 800 MG tablet Take 800 mg by mouth every 8 (eight) hours as needed (pain).     Marland Kitchen losartan-hydrochlorothiazide (HYZAAR) 100-12.5 MG tablet Take 1 tablet by mouth daily.  0 10/28/2017 at Unknown time  . Multiple Vitamins-Minerals (MULTIVITAMIN WITH MINERALS) tablet Take 1 tablet by mouth daily.     . rosuvastatin (CRESTOR) 20 MG tablet Take 20 mg by mouth daily.   1 10/29/2017 at 0500   Allergies  Allergen Reactions  . Lisinopril Cough    Social History   Tobacco Use  . Smoking status: Former Smoker    Last attempt to quit: 10/20/2016    Years since quitting: 1.2  . Smokeless tobacco: Never Used  Substance  Use Topics  . Alcohol use: Never    Frequency: Never       Review of Systems  Constitutional: Negative.   HENT: Positive for hearing loss.   Eyes: Negative.   Respiratory: Negative.   Cardiovascular: Negative.   Gastrointestinal: Negative.   Genitourinary: Negative.   Musculoskeletal: Positive for joint pain.  Skin: Negative.   Neurological: Negative.   Endo/Heme/Allergies: Negative.   Psychiatric/Behavioral: Negative.     Objective:  Physical Exam  Constitutional: She is oriented to person, place, and time. She appears well-developed.  HENT:  Head: Normocephalic.  Mouth/Throat: She has dentures.   Eyes: Pupils are equal, round, and reactive to light.  Neck: Neck supple. No JVD present. No tracheal deviation present. No thyromegaly present.  Cardiovascular: Normal rate, regular rhythm and intact distal pulses.  Respiratory: Effort normal and breath sounds normal. No respiratory distress. She has no wheezes.  GI: Soft. There is no tenderness. There is no guarding.  Musculoskeletal:       Right hip: She exhibits decreased range of motion, decreased strength, tenderness and bony tenderness. She exhibits no swelling, no deformity and no laceration.  Lymphadenopathy:    She has no cervical adenopathy.  Neurological: She is alert and oriented to person, place, and time.  Skin: Skin is warm and dry.  Psychiatric: She has a normal mood and affect.      Labs:  Estimated body mass index is 37.61 kg/m as calculated from the following:   Height as of 10/29/17: 5\' 6"  (1.676 m).   Weight as of 10/29/17: 105.7 kg.   Imaging Review Plain radiographs demonstrate severe degenerative joint disease of the right hip. The bone quality appears to be good for age and reported activity level.    Preoperative templating of the joint replacement has been completed, documented, and submitted to the Operating Room personnel in order to optimize intra-operative equipment management.     Assessment/Plan:  End stage arthritis, right hip  The patient history, physical examination, clinical judgement of the provider and imaging studies are consistent with end stage degenerative joint disease of the right hip and total hip arthroplasty is deemed medically necessary. The treatment options including medical management, injection therapy, arthroscopy and arthroplasty were discussed at length. The risks and benefits of total hip arthroplasty were presented and reviewed. The risks due to aseptic loosening, infection, stiffness, dislocation/subluxation,  thromboembolic complications and other imponderables were  discussed.  The patient acknowledged the explanation, agreed to proceed with the plan and consent was signed. Patient is being admitted for inpatient treatment for surgery, pain control, PT, OT, prophylactic antibiotics, VTE prophylaxis, progressive ambulation and ADL's and discharge planning.The patient is planning to be discharged home.     [place] Rajni Holsworth   PA-C  01/06/2018, 8:32 AM

## 2018-01-08 ENCOUNTER — Encounter (HOSPITAL_COMMUNITY): Payer: Self-pay

## 2018-01-08 ENCOUNTER — Other Ambulatory Visit: Payer: Self-pay

## 2018-01-08 ENCOUNTER — Encounter (HOSPITAL_COMMUNITY)
Admission: RE | Admit: 2018-01-08 | Discharge: 2018-01-08 | Disposition: A | Discharge status: Home / Self Care | Payer: Medicare HMO | Source: Ambulatory Visit | Attending: Orthopedic Surgery | Admitting: Orthopedic Surgery

## 2018-01-08 DIAGNOSIS — R001 Bradycardia, unspecified: Secondary | ICD-10-CM | POA: Diagnosis not present

## 2018-01-08 DIAGNOSIS — I10 Essential (primary) hypertension: Secondary | ICD-10-CM | POA: Diagnosis not present

## 2018-01-08 DIAGNOSIS — Z01818 Encounter for other preprocedural examination: Secondary | ICD-10-CM | POA: Insufficient documentation

## 2018-01-08 LAB — BASIC METABOLIC PANEL
Anion gap: 7 (ref 5–15)
BUN: 23 mg/dL (ref 8–23)
CO2: 30 mmol/L (ref 22–32)
Calcium: 10.6 mg/dL — ABNORMAL HIGH (ref 8.9–10.3)
Chloride: 108 mmol/L (ref 98–111)
Creatinine, Ser: 1.49 mg/dL — ABNORMAL HIGH (ref 0.44–1.00)
GFR calc Af Amer: 41 mL/min — ABNORMAL LOW (ref >60)
GFR, EST NON AFRICAN AMERICAN: 35 mL/min — AB (ref >60)
Glucose, Bld: 88 mg/dL (ref 70–99)
POTASSIUM: 5 mmol/L (ref 3.5–5.1)
Sodium: 145 mmol/L (ref 135–145)

## 2018-01-08 LAB — CBC
HCT: 37.4 % (ref 36.0–46.0)
HEMOGLOBIN: 12 g/dL (ref 12.0–15.0)
MCH: 29.3 pg (ref 26.0–34.0)
MCHC: 32.1 g/dL (ref 30.0–36.0)
MCV: 91.2 fL (ref 80.0–100.0)
Platelets: 147 10*3/uL — ABNORMAL LOW (ref 150–400)
RBC: 4.1 MIL/uL (ref 3.87–5.11)
RDW: 12.7 % (ref 11.5–15.5)
WBC: 5.3 10*3/uL (ref 4.0–10.5)
nRBC: 0 % (ref 0.0–0.2)

## 2018-01-08 LAB — SURGICAL PCR SCREEN
MRSA, PCR: NEGATIVE
STAPHYLOCOCCUS AUREUS: POSITIVE — AB

## 2018-01-09 NOTE — Progress Notes (Signed)
Patient calling to request Mupirocin ointment to be called in to Pharmacy as she did not have a tube like she told the pre-op nurse during her pre-op visit. Patient will receive Betadine to nose as she does not have enough time to get in 10 doses of ointment before surgery. Patient verbalized understanding.

## 2018-01-14 ENCOUNTER — Inpatient Hospital Stay (HOSPITAL_COMMUNITY): Payer: Medicare HMO | Admitting: Anesthesiology

## 2018-01-14 ENCOUNTER — Encounter (HOSPITAL_COMMUNITY)
Admission: RE | Disposition: A | Discharge status: Home / Self Care | Payer: Self-pay | Source: Ambulatory Visit | Attending: Orthopedic Surgery

## 2018-01-14 ENCOUNTER — Encounter (HOSPITAL_COMMUNITY): Payer: Self-pay | Admitting: Certified Registered"

## 2018-01-14 ENCOUNTER — Inpatient Hospital Stay (HOSPITAL_COMMUNITY)
Admission: RE | Admit: 2018-01-14 | Discharge: 2018-01-15 | DRG: 470 | Disposition: A | Discharge status: Home / Self Care | Payer: Medicare HMO | Source: Ambulatory Visit | Attending: Orthopedic Surgery | Admitting: Orthopedic Surgery

## 2018-01-14 ENCOUNTER — Inpatient Hospital Stay (HOSPITAL_COMMUNITY): Payer: Medicare HMO

## 2018-01-14 ENCOUNTER — Other Ambulatory Visit: Payer: Self-pay

## 2018-01-14 DIAGNOSIS — E7800 Pure hypercholesterolemia, unspecified: Secondary | ICD-10-CM | POA: Diagnosis present

## 2018-01-14 DIAGNOSIS — Z96642 Presence of left artificial hip joint: Secondary | ICD-10-CM | POA: Diagnosis present

## 2018-01-14 DIAGNOSIS — N183 Chronic kidney disease, stage 3 (moderate): Secondary | ICD-10-CM | POA: Diagnosis not present

## 2018-01-14 DIAGNOSIS — I10 Essential (primary) hypertension: Secondary | ICD-10-CM | POA: Diagnosis not present

## 2018-01-14 DIAGNOSIS — H9190 Unspecified hearing loss, unspecified ear: Secondary | ICD-10-CM | POA: Diagnosis present

## 2018-01-14 DIAGNOSIS — Z96641 Presence of right artificial hip joint: Secondary | ICD-10-CM

## 2018-01-14 DIAGNOSIS — F17210 Nicotine dependence, cigarettes, uncomplicated: Secondary | ICD-10-CM | POA: Diagnosis present

## 2018-01-14 DIAGNOSIS — I129 Hypertensive chronic kidney disease with stage 1 through stage 4 chronic kidney disease, or unspecified chronic kidney disease: Secondary | ICD-10-CM | POA: Diagnosis present

## 2018-01-14 DIAGNOSIS — Z96649 Presence of unspecified artificial hip joint: Secondary | ICD-10-CM

## 2018-01-14 DIAGNOSIS — M25551 Pain in right hip: Secondary | ICD-10-CM | POA: Diagnosis present

## 2018-01-14 DIAGNOSIS — E781 Pure hyperglyceridemia: Secondary | ICD-10-CM | POA: Diagnosis not present

## 2018-01-14 DIAGNOSIS — Z7982 Long term (current) use of aspirin: Secondary | ICD-10-CM | POA: Diagnosis not present

## 2018-01-14 DIAGNOSIS — E8881 Metabolic syndrome: Secondary | ICD-10-CM | POA: Diagnosis present

## 2018-01-14 DIAGNOSIS — M1611 Unilateral primary osteoarthritis, right hip: Principal | ICD-10-CM | POA: Diagnosis present

## 2018-01-14 DIAGNOSIS — Z888 Allergy status to other drugs, medicaments and biological substances status: Secondary | ICD-10-CM

## 2018-01-14 DIAGNOSIS — Z471 Aftercare following joint replacement surgery: Secondary | ICD-10-CM | POA: Diagnosis not present

## 2018-01-14 DIAGNOSIS — R69 Illness, unspecified: Secondary | ICD-10-CM | POA: Diagnosis not present

## 2018-01-14 DIAGNOSIS — Z419 Encounter for procedure for purposes other than remedying health state, unspecified: Secondary | ICD-10-CM

## 2018-01-14 HISTORY — PX: TOTAL HIP ARTHROPLASTY: SHX124

## 2018-01-14 LAB — TYPE AND SCREEN
ABO/RH(D): O POS
Antibody Screen: NEGATIVE

## 2018-01-14 SURGERY — ARTHROPLASTY, HIP, TOTAL, ANTERIOR APPROACH
Anesthesia: General | Site: Hip | Laterality: Right

## 2018-01-14 MED ORDER — CHLORHEXIDINE GLUCONATE 4 % EX LIQD: 60.0000 mL | Freq: Once | CUTANEOUS | Status: DC

## 2018-01-14 MED ORDER — ONDANSETRON HCL 4 MG/2ML IJ SOLN
INTRAMUSCULAR | Status: AC
  Filled 2018-01-14: qty 2

## 2018-01-14 MED ORDER — PHENOL 1.4 % MT LIQD: 1.0000 | OROMUCOSAL | Status: DC | PRN

## 2018-01-14 MED ORDER — HYDROCHLOROTHIAZIDE 12.5 MG PO CAPS
12.5000 mg | ORAL_CAPSULE | Freq: Every day | ORAL | Status: DC
  Filled 2018-01-14: qty 1

## 2018-01-14 MED ORDER — SODIUM CHLORIDE 0.9 % IV SOLN
INTRAVENOUS | Status: DC
  Administered 2018-01-14 – 2018-01-15 (×2): via INTRAVENOUS

## 2018-01-14 MED ORDER — PHENYLEPHRINE 40 MCG/ML (10ML) SYRINGE FOR IV PUSH (FOR BLOOD PRESSURE SUPPORT)
PREFILLED_SYRINGE | INTRAVENOUS | Status: DC | PRN
  Administered 2018-01-14: 80 ug via INTRAVENOUS
  Administered 2018-01-14: 40 ug via INTRAVENOUS
  Administered 2018-01-14 (×2): 80 ug via INTRAVENOUS
  Administered 2018-01-14: 40 ug via INTRAVENOUS
  Administered 2018-01-14: 120 ug via INTRAVENOUS
  Administered 2018-01-14 (×2): 40 ug via INTRAVENOUS

## 2018-01-14 MED ORDER — POLYETHYLENE GLYCOL 3350 17 G PO PACK
17.0000 g | PACK | Freq: Two times a day (BID) | ORAL | Status: DC
  Filled 2018-01-14 (×2): qty 1

## 2018-01-14 MED ORDER — TRANEXAMIC ACID-NACL 1000-0.7 MG/100ML-% IV SOLN
1000.0000 mg | INTRAVENOUS | Status: AC
  Administered 2018-01-14: 1000 mg via INTRAVENOUS
  Filled 2018-01-14: qty 100

## 2018-01-14 MED ORDER — FENTANYL CITRATE (PF) 100 MCG/2ML IJ SOLN
INTRAMUSCULAR | Status: DC | PRN
  Administered 2018-01-14 (×7): 25 ug via INTRAVENOUS
  Administered 2018-01-14 (×2): 50 ug via INTRAVENOUS

## 2018-01-14 MED ORDER — ONDANSETRON HCL 4 MG PO TABS: 4.0000 mg | ORAL_TABLET | Freq: Four times a day (QID) | ORAL | Status: DC | PRN

## 2018-01-14 MED ORDER — LACTATED RINGERS IV SOLN
INTRAVENOUS | Status: DC
  Administered 2018-01-14: 10:00:00 via INTRAVENOUS

## 2018-01-14 MED ORDER — FENTANYL CITRATE (PF) 100 MCG/2ML IJ SOLN
INTRAMUSCULAR | Status: AC
  Filled 2018-01-14: qty 2

## 2018-01-14 MED ORDER — HYDROCODONE-ACETAMINOPHEN 7.5-325 MG PO TABS: 1.0000 | ORAL_TABLET | ORAL | 0 refills | Status: AC | PRN

## 2018-01-14 MED ORDER — ALUM & MAG HYDROXIDE-SIMETH 200-200-20 MG/5ML PO SUSP: 15.0000 mL | ORAL | Status: DC | PRN

## 2018-01-14 MED ORDER — GLYCOPYRROLATE PF 0.2 MG/ML IJ SOSY
PREFILLED_SYRINGE | INTRAMUSCULAR | Status: AC
  Filled 2018-01-14: qty 1

## 2018-01-14 MED ORDER — LIP MEDEX EX OINT
TOPICAL_OINTMENT | CUTANEOUS | Status: AC
  Filled 2018-01-14: qty 7

## 2018-01-14 MED ORDER — PHENYLEPHRINE HCL 10 MG/ML IJ SOLN
INTRAMUSCULAR | Status: AC
  Filled 2018-01-14: qty 1

## 2018-01-14 MED ORDER — HYDROMORPHONE HCL 1 MG/ML IJ SOLN
INTRAMUSCULAR | Status: AC
  Filled 2018-01-14: qty 1

## 2018-01-14 MED ORDER — METHOCARBAMOL 500 MG PO TABS: 500.0000 mg | ORAL_TABLET | Freq: Four times a day (QID) | ORAL | 0 refills | Status: AC | PRN

## 2018-01-14 MED ORDER — METOCLOPRAMIDE HCL 5 MG/ML IJ SOLN: 5.0000 mg | Freq: Three times a day (TID) | INTRAMUSCULAR | Status: DC | PRN

## 2018-01-14 MED ORDER — METHOCARBAMOL 500 MG PO TABS
500.0000 mg | ORAL_TABLET | Freq: Four times a day (QID) | ORAL | Status: DC | PRN
  Administered 2018-01-14: 500 mg via ORAL
  Filled 2018-01-14: qty 1

## 2018-01-14 MED ORDER — LOSARTAN POTASSIUM 50 MG PO TABS
100.0000 mg | ORAL_TABLET | Freq: Every day | ORAL | Status: DC
  Filled 2018-01-14: qty 2

## 2018-01-14 MED ORDER — CEFAZOLIN SODIUM-DEXTROSE 2-4 GM/100ML-% IV SOLN
2.0000 g | Freq: Four times a day (QID) | INTRAVENOUS | Status: AC
  Administered 2018-01-14 (×2): 2 g via INTRAVENOUS
  Filled 2018-01-14 (×2): qty 100

## 2018-01-14 MED ORDER — FERROUS SULFATE 325 (65 FE) MG PO TABS
325.0000 mg | ORAL_TABLET | Freq: Three times a day (TID) | ORAL | Status: DC
  Administered 2018-01-15 (×2): 325 mg via ORAL
  Filled 2018-01-14 (×2): qty 1

## 2018-01-14 MED ORDER — STERILE WATER FOR IRRIGATION IR SOLN
Status: DC | PRN
  Administered 2018-01-14: 2000 mL

## 2018-01-14 MED ORDER — DEXAMETHASONE SODIUM PHOSPHATE 10 MG/ML IJ SOLN
10.0000 mg | Freq: Once | INTRAMUSCULAR | Status: AC
  Administered 2018-01-15: 10 mg via INTRAVENOUS
  Filled 2018-01-14: qty 1

## 2018-01-14 MED ORDER — ONDANSETRON HCL 4 MG/2ML IJ SOLN
INTRAMUSCULAR | Status: DC | PRN
  Administered 2018-01-14: 4 mg via INTRAVENOUS

## 2018-01-14 MED ORDER — DEXAMETHASONE SODIUM PHOSPHATE 10 MG/ML IJ SOLN
INTRAMUSCULAR | Status: AC
  Filled 2018-01-14: qty 1

## 2018-01-14 MED ORDER — ONDANSETRON HCL 4 MG/2ML IJ SOLN: 4.0000 mg | Freq: Four times a day (QID) | INTRAMUSCULAR | Status: DC | PRN

## 2018-01-14 MED ORDER — LIDOCAINE 2% (20 MG/ML) 5 ML SYRINGE
INTRAMUSCULAR | Status: AC
  Filled 2018-01-14: qty 5

## 2018-01-14 MED ORDER — HYDROCODONE-ACETAMINOPHEN 7.5-325 MG PO TABS
1.0000 | ORAL_TABLET | ORAL | Status: DC | PRN
  Administered 2018-01-15: 1 via ORAL
  Filled 2018-01-14: qty 1

## 2018-01-14 MED ORDER — ACETAMINOPHEN 325 MG PO TABS: 325.0000 mg | ORAL_TABLET | Freq: Four times a day (QID) | ORAL | Status: DC | PRN

## 2018-01-14 MED ORDER — MIDAZOLAM HCL 2 MG/2ML IJ SOLN
INTRAMUSCULAR | Status: AC
  Filled 2018-01-14: qty 2

## 2018-01-14 MED ORDER — DIPHENHYDRAMINE HCL 50 MG/ML IJ SOLN
12.5000 mg | Freq: Four times a day (QID) | INTRAMUSCULAR | Status: AC | PRN
  Administered 2018-01-14: 12.5 mg via INTRAVENOUS

## 2018-01-14 MED ORDER — ROSUVASTATIN CALCIUM 20 MG PO TABS
20.0000 mg | ORAL_TABLET | Freq: Every day | ORAL | Status: DC
  Administered 2018-01-14 – 2018-01-15 (×2): 20 mg via ORAL
  Filled 2018-01-14 (×2): qty 1

## 2018-01-14 MED ORDER — DEXAMETHASONE SODIUM PHOSPHATE 10 MG/ML IJ SOLN
INTRAMUSCULAR | Status: DC | PRN
  Administered 2018-01-14: 10 mg via INTRAVENOUS

## 2018-01-14 MED ORDER — ASPIRIN 81 MG PO CHEW
81.0000 mg | CHEWABLE_TABLET | Freq: Two times a day (BID) | ORAL | Status: DC
  Administered 2018-01-14 – 2018-01-15 (×2): 81 mg via ORAL
  Filled 2018-01-14 (×4): qty 1

## 2018-01-14 MED ORDER — DEXAMETHASONE SODIUM PHOSPHATE 10 MG/ML IJ SOLN: 10.0000 mg | Freq: Once | INTRAMUSCULAR | Status: DC

## 2018-01-14 MED ORDER — OXYCODONE HCL 5 MG/5ML PO SOLN: 5.0000 mg | Freq: Once | ORAL | Status: DC | PRN

## 2018-01-14 MED ORDER — FENTANYL CITRATE (PF) 100 MCG/2ML IJ SOLN: 25.0000 ug | INTRAMUSCULAR | Status: DC | PRN

## 2018-01-14 MED ORDER — CELECOXIB 200 MG PO CAPS
200.0000 mg | ORAL_CAPSULE | Freq: Two times a day (BID) | ORAL | Status: DC
  Administered 2018-01-14 – 2018-01-15 (×2): 200 mg via ORAL
  Filled 2018-01-14 (×2): qty 1

## 2018-01-14 MED ORDER — LIDOCAINE 2% (20 MG/ML) 5 ML SYRINGE
INTRAMUSCULAR | Status: DC | PRN
  Administered 2018-01-14: 60 mg via INTRAVENOUS

## 2018-01-14 MED ORDER — METHOCARBAMOL 500 MG IVPB - SIMPLE MED
500.0000 mg | Freq: Four times a day (QID) | INTRAVENOUS | Status: DC | PRN
  Filled 2018-01-14: qty 50

## 2018-01-14 MED ORDER — ASPIRIN 81 MG PO CHEW: 81.0000 mg | CHEWABLE_TABLET | Freq: Two times a day (BID) | ORAL | 0 refills | Status: AC

## 2018-01-14 MED ORDER — MAGNESIUM CITRATE PO SOLN: 1.0000 | Freq: Once | ORAL | Status: DC | PRN

## 2018-01-14 MED ORDER — HYDROMORPHONE HCL 1 MG/ML IJ SOLN
0.5000 mg | INTRAMUSCULAR | Status: DC | PRN
  Administered 2018-01-14: 1 mg via INTRAVENOUS
  Filled 2018-01-14: qty 1

## 2018-01-14 MED ORDER — POLYETHYLENE GLYCOL 3350 17 G PO PACK: 17.0000 g | PACK | Freq: Two times a day (BID) | ORAL | 0 refills | Status: AC

## 2018-01-14 MED ORDER — DOCUSATE SODIUM 100 MG PO CAPS
100.0000 mg | ORAL_CAPSULE | Freq: Two times a day (BID) | ORAL | Status: DC
  Administered 2018-01-14 – 2018-01-15 (×2): 100 mg via ORAL
  Filled 2018-01-14 (×2): qty 1

## 2018-01-14 MED ORDER — TRANEXAMIC ACID-NACL 1000-0.7 MG/100ML-% IV SOLN
1000.0000 mg | Freq: Once | INTRAVENOUS | Status: AC
  Administered 2018-01-14: 1000 mg via INTRAVENOUS
  Filled 2018-01-14: qty 100

## 2018-01-14 MED ORDER — PROPOFOL 10 MG/ML IV BOLUS
INTRAVENOUS | Status: DC | PRN
  Administered 2018-01-14: 100 mg via INTRAVENOUS
  Administered 2018-01-14: 150 mg via INTRAVENOUS

## 2018-01-14 MED ORDER — PROPOFOL 10 MG/ML IV BOLUS
INTRAVENOUS | Status: AC
  Filled 2018-01-14: qty 40

## 2018-01-14 MED ORDER — MENTHOL 3 MG MT LOZG: 1.0000 | LOZENGE | OROMUCOSAL | Status: DC | PRN

## 2018-01-14 MED ORDER — DOCUSATE SODIUM 100 MG PO CAPS: 100.0000 mg | ORAL_CAPSULE | Freq: Two times a day (BID) | ORAL | 0 refills | Status: AC

## 2018-01-14 MED ORDER — OXYCODONE HCL 5 MG PO TABS: 5.0000 mg | ORAL_TABLET | Freq: Once | ORAL | Status: DC | PRN

## 2018-01-14 MED ORDER — CEFAZOLIN SODIUM-DEXTROSE 2-4 GM/100ML-% IV SOLN
2.0000 g | INTRAVENOUS | Status: AC
  Administered 2018-01-14: 2 g via INTRAVENOUS
  Filled 2018-01-14: qty 100

## 2018-01-14 MED ORDER — DIPHENHYDRAMINE HCL 12.5 MG/5ML PO ELIX: 12.5000 mg | ORAL_SOLUTION | ORAL | Status: DC | PRN

## 2018-01-14 MED ORDER — PHENYLEPHRINE 40 MCG/ML (10ML) SYRINGE FOR IV PUSH (FOR BLOOD PRESSURE SUPPORT)
PREFILLED_SYRINGE | INTRAVENOUS | Status: AC
  Filled 2018-01-14: qty 10

## 2018-01-14 MED ORDER — GLYCOPYRROLATE 0.2 MG/ML IJ SOLN
INTRAMUSCULAR | Status: DC | PRN
  Administered 2018-01-14 (×2): 0.1 mg via INTRAVENOUS

## 2018-01-14 MED ORDER — DIPHENHYDRAMINE HCL 50 MG/ML IJ SOLN
INTRAMUSCULAR | Status: AC
  Filled 2018-01-14: qty 1

## 2018-01-14 MED ORDER — METOCLOPRAMIDE HCL 5 MG PO TABS: 5.0000 mg | ORAL_TABLET | Freq: Three times a day (TID) | ORAL | Status: DC | PRN

## 2018-01-14 MED ORDER — FERROUS SULFATE 325 (65 FE) MG PO TABS: 325.0000 mg | ORAL_TABLET | Freq: Three times a day (TID) | ORAL | 3 refills | Status: AC

## 2018-01-14 MED ORDER — HYDROCODONE-ACETAMINOPHEN 5-325 MG PO TABS
1.0000 | ORAL_TABLET | ORAL | Status: DC | PRN
  Administered 2018-01-14 – 2018-01-15 (×3): 1 via ORAL
  Administered 2018-01-15: 2 via ORAL
  Filled 2018-01-14: qty 1
  Filled 2018-01-14: qty 2
  Filled 2018-01-14 (×2): qty 1

## 2018-01-14 MED ORDER — LOSARTAN POTASSIUM-HCTZ 100-12.5 MG PO TABS: 1.0000 | ORAL_TABLET | Freq: Every day | ORAL | Status: DC

## 2018-01-14 MED ORDER — ONDANSETRON HCL 4 MG/2ML IJ SOLN: 4.0000 mg | Freq: Once | INTRAMUSCULAR | Status: DC | PRN

## 2018-01-14 MED ORDER — MIDAZOLAM HCL 5 MG/5ML IJ SOLN
INTRAMUSCULAR | Status: DC | PRN
  Administered 2018-01-14: 2 mg via INTRAVENOUS

## 2018-01-14 MED ORDER — SODIUM CHLORIDE 0.9 % IR SOLN
Status: DC | PRN
  Administered 2018-01-14: 1000 mL

## 2018-01-14 MED ORDER — HYDROMORPHONE HCL 1 MG/ML IJ SOLN
0.2500 mg | INTRAMUSCULAR | Status: DC | PRN
  Administered 2018-01-14 (×4): 0.5 mg via INTRAVENOUS

## 2018-01-14 MED ORDER — BISACODYL 10 MG RE SUPP: 10.0000 mg | Freq: Every day | RECTAL | Status: DC | PRN

## 2018-01-14 SURGICAL SUPPLY — 35 items
BLADE SAG 18X100X1.27 (BLADE) ×2 IMPLANT
COVER PERINEAL POST (MISCELLANEOUS) ×2 IMPLANT
COVER SURGICAL LIGHT HANDLE (MISCELLANEOUS) ×2 IMPLANT
COVER WAND RF STERILE (DRAPES) IMPLANT
CUP ACETBLR 52 OD PINNACLE (Hips) ×2 IMPLANT
DERMABOND ADVANCED (GAUZE/BANDAGES/DRESSINGS) ×1
DERMABOND ADVANCED .7 DNX12 (GAUZE/BANDAGES/DRESSINGS) ×1 IMPLANT
DRAPE STERI IOBAN 125X83 (DRAPES) ×2 IMPLANT
DRAPE U-SHAPE 47X51 STRL (DRAPES) ×4 IMPLANT
DRESSING AQUACEL AG SP 3.5X10 (GAUZE/BANDAGES/DRESSINGS) ×1 IMPLANT
DRSG AQUACEL AG SP 3.5X10 (GAUZE/BANDAGES/DRESSINGS) ×2
DURAPREP 26ML APPLICATOR (WOUND CARE) ×2 IMPLANT
ELECT REM PT RETURN 15FT ADLT (MISCELLANEOUS) ×2 IMPLANT
ELIMINATOR HOLE APEX DEPUY (Hips) ×2 IMPLANT
GLOVE BIOGEL M STRL SZ7.5 (GLOVE) ×2 IMPLANT
GLOVE BIOGEL PI IND STRL 7.5 (GLOVE) ×2 IMPLANT
GLOVE BIOGEL PI INDICATOR 7.5 (GLOVE) ×2
GLOVE ECLIPSE 8.0 STRL XLNG CF (GLOVE) ×4 IMPLANT
GOWN STRL REUS W/TWL 2XL LVL3 (GOWN DISPOSABLE) ×2 IMPLANT
GOWN STRL REUS W/TWL LRG LVL3 (GOWN DISPOSABLE) ×2 IMPLANT
HEAD CERAMIC 36 PLUS5 (Hips) ×2 IMPLANT
HOLDER FOLEY CATH W/STRAP (MISCELLANEOUS) ×2 IMPLANT
LINER NEUTRAL 52X36MM PLUS 4 (Liner) ×2 IMPLANT
PACK ANTERIOR HIP CUSTOM (KITS) ×2 IMPLANT
SCREW 6.5MMX25MM (Screw) ×2 IMPLANT
STEM FEM SZ3 STD ACTIS (Stem) ×2 IMPLANT
SUT MNCRL AB 4-0 PS2 18 (SUTURE) ×2 IMPLANT
SUT STRATAFIX 0 PDS 27 VIOLET (SUTURE) ×2
SUT VIC AB 1 CT1 36 (SUTURE) ×6 IMPLANT
SUT VIC AB 2-0 CT1 27 (SUTURE) ×2
SUT VIC AB 2-0 CT1 TAPERPNT 27 (SUTURE) ×2 IMPLANT
SUTURE STRATFX 0 PDS 27 VIOLET (SUTURE) ×1 IMPLANT
TRAY FOLEY MTR SLVR 16FR STAT (SET/KITS/TRAYS/PACK) IMPLANT
WATER STERILE IRR 1000ML POUR (IV SOLUTION) ×4 IMPLANT
YANKAUER SUCT BULB TIP 10FT TU (MISCELLANEOUS) ×2 IMPLANT

## 2018-01-14 NOTE — Op Note (Signed)
NAME:  Sherry Robinson [facility]                ACCOUNT NO.: [id number]      MEDICAL RECORD NO.: [id number]      FACILITY:  [facility]      PHYSICIAN:  Mauri Pole  DATE OF BIRTH:  May 17, 1950     DATE OF PROCEDURE:  01/14/2018                                 OPERATIVE REPORT         PREOPERATIVE DIAGNOSIS: Right  hip osteoarthritis.      POSTOPERATIVE DIAGNOSIS:  Right hip osteoarthritis.      PROCEDURE:  Right total hip replacement through an anterior approach   utilizing DePuy THR system, component size 85mm pinnacle cup, a size 36+4 neutral   Altrex liner, a size 3 standard Actis stem with a 36+5 delta ceramic   ball.      SURGEON:  Pietro Cassis. Alvan Dame, M.D.      ASSISTANT:  Nehemiah Massed, PA-C     ANESTHESIA:  General.      SPECIMENS:  None.      COMPLICATIONS:  None.      BLOOD LOSS:  400 cc     DRAINS:  None.      INDICATION OF THE PROCEDURE:  Sherry Robinson is a 67 y.o. female who had   presented to office for evaluation of right hip pain.  Radiographs revealed   progressive degenerative changes with bone-on-bone   articulation of the  hip joint, including subchondral cystic changes and osteophytes.  The patient had painful limited range of   motion significantly affecting their overall quality of life and function.  The patient was failing to    respond to conservative measures including medications and/or injections and activity modification and at this point was ready   to proceed with more definitive measures.  Consent was obtained for   benefit of pain relief.  Specific risks of infection, DVT, component   failure, dislocation, neurovascular injury, and need for revision surgery were reviewed in the office as well discussion of   the anterior versus posterior approach were reviewed.     PROCEDURE IN DETAIL:  The patient was brought to operative theater.   Once adequate anesthesia, preoperative antibiotics, 2 gm of Ancef, 1 gm of Tranexamic Acid,  and 10 mg of Decadron were administered, the patient was positioned supine on the [employer] table.  Once the patient was safely positioned with adequate padding of boney prominences we predraped out the hip, and used fluoroscopy to confirm orientation of the pelvis.      The right hip was then prepped and draped from proximal iliac crest to   mid thigh with a shower curtain technique.      Time-out was performed identifying the patient, planned procedure, and the appropriate extremity.     An incision was then made 2 cm lateral to the   anterior superior iliac spine extending over the orientation of the   tensor fascia lata muscle and sharp dissection was carried down to the   fascia of the muscle.      The fascia was then incised.  The muscle belly was identified and swept   laterally and retractor placed along the superior neck.  Following   cauterization of the circumflex vessels and removing some pericapsular  fat, a second cobra retractor was placed on the inferior neck.  A T-capsulotomy was made along the line of the   superior neck to the trochanteric fossa, then extended proximally and   distally.  Tag sutures were placed and the retractors were then placed   intracapsular.  We then identified the trochanteric fossa and   orientation of my neck cut and then made a neck osteotomy with the femur on traction.  The femoral   head was removed without difficulty or complication.  Traction was let   off and retractors were placed posterior and anterior around the   acetabulum.      The labrum and foveal tissue were debrided.  I began reaming with a 44 mm   reamer and reamed up to 51 mm reamer with good bony bed preparation and a 52 mm  cup was chosen.  The final 52 mm Pinnacle cup was then impacted under fluoroscopy to confirm the depth of penetration and orientation with respect to   Abduction and forward flexion.  A screw was placed into the ilium followed by the hole eliminator.  The  final   36+4 neutral Altrex liner was impacted with good visualized rim fit.  The cup was positioned anatomically within the acetabular portion of the pelvis.      At this point, the femur was rolled to 100 degrees.  Further capsule was   released off the inferior aspect of the femoral neck.  I then   released the superior capsule proximally.  With the leg in a neutral position the hook was placed laterally   along the femur under the vastus lateralis origin and elevated manually and then held in position using the hook attachment on the bed.  The leg was then extended and adducted with the leg rolled to 100   degrees of external rotation.  Retractors were placed along the medial calcar and posteriorly over the greater trochanter.  Once the proximal femur was fully   exposed, I used a box osteotome to set orientation.  I then began   broaching with the starting chili pepper broach and passed this by hand and then broached up to 3.  With the 3 broach in place I chose a standard neck and did several trial reductions to try and best match the other hip previously replaced.  The offset was appropriate, leg lengths   appeared to be equal best matched with the +5 head ball trial confirmed radiographically.   Given these findings, I went ahead and dislocated the hip, repositioned all   retractors and positioned the right hip in the extended and abducted position.  The final 3 standard Actis femoral stem was   chosen and it was impacted down to the level of neck cut.  Based on this   and the trial reductions, a final 36+5 delta ceramic ball was chosen and   impacted onto a clean and dry trunnion, and the hip was reduced.  The   hip had been irrigated throughout the case again at this point.  I did   reapproximate the superior capsular leaflet to the anterior leaflet   using #1 Vicryl.  The fascia of the   tensor fascia lata muscle was then reapproximated using #1 Vicryl and #0 Stratafix sutures.  The    remaining wound was closed with 2-0 Vicryl and running 4-0 Monocryl.   The hip was cleaned, dried, and dressed sterilely using Dermabond and   Aquacel dressing.  The  patient was then brought   to recovery room in stable condition tolerating the procedure well.    Nehemiah Massed, PA-C was present for the entirety of the case involved from   preoperative positioning, perioperative retractor management, general   facilitation of the case, as well as primary wound closure as assistant.            Pietro Cassis Alvan Dame, M.D.        01/14/2018 1:01 PM

## 2018-01-14 NOTE — Discharge Instructions (Signed)

## 2018-01-14 NOTE — Transfer of Care (Signed)
Immediate Anesthesia Transfer of Care Note  Patient: Sherry Robinson  Procedure(s) Performed: RIGHT TOTAL HIP ARTHROPLASTY ANTERIOR APPROACH (Right Hip)  Patient Location: PACU  Anesthesia Type:General  Level of Consciousness: drowsy  Airway & Oxygen Therapy: Patient Spontanous Breathing and Patient connected to face mask oxygen  Post-op Assessment: Report given to RN and Post -op Vital signs reviewed and stable  Post vital signs: Reviewed and stable  Last Vitals:  Vitals Value Taken Time  BP    Temp    Pulse 80 01/14/2018  1:34 PM  Resp 14 01/14/2018  1:34 PM  SpO2 96 % 01/14/2018  1:34 PM  Vitals shown include unvalidated device data.  Last Pain:  Vitals:   01/14/18 0928  TempSrc:   PainSc: 0-No pain         Complications: No apparent anesthesia complications

## 2018-01-14 NOTE — Anesthesia Preprocedure Evaluation (Addendum)
Anesthesia Evaluation  Patient identified by MRN, date of birth, ID band Patient awake    Reviewed: Allergy & Precautions, NPO status , Patient's Chart, lab work & pertinent test results  History of Anesthesia Complications Negative for: history of anesthetic complications  Airway Mallampati: II  TM Distance: >3 FB Neck ROM: Full    Dental  (+) Edentulous Upper   Pulmonary neg pulmonary ROS, former smoker,    Pulmonary exam normal        Cardiovascular hypertension, Normal cardiovascular exam     Neuro/Psych negative neurological ROS  negative psych ROS   GI/Hepatic negative GI ROS, Neg liver ROS,   Endo/Other  negative endocrine ROS  Renal/GU Renal InsufficiencyRenal disease (CKD stage III, Cr 1.49)  negative genitourinary   Musculoskeletal  (+) Arthritis , Osteoarthritis,    Abdominal   Peds  Hematology negative hematology ROS (+)   Anesthesia Other Findings   Reproductive/Obstetrics                            Anesthesia Physical Anesthesia Plan  ASA: III  Anesthesia Plan: Spinal   Post-op Pain Management:    Induction:   PONV Risk Score and Plan: 2 and Propofol infusion  Airway Management Planned: Natural Airway, Nasal Cannula and Simple Face Mask  Additional Equipment: None  Intra-op Plan:   Post-operative Plan:   Informed Consent: I have reviewed the patients History and Physical, chart, labs and discussed the procedure including the risks, benefits and alternatives for the proposed anesthesia with the patient or authorized representative who has indicated his/her understanding and acceptance.     Plan Discussed with:   Anesthesia Plan Comments:        Anesthesia Quick Evaluation

## 2018-01-14 NOTE — Anesthesia Postprocedure Evaluation (Signed)
Anesthesia Post Note  Patient: Sherry Robinson  Procedure(s) Performed: RIGHT TOTAL HIP ARTHROPLASTY ANTERIOR APPROACH (Right Hip)     Anesthesia Type: General    Last Vitals:  Vitals:   01/14/18 1557 01/14/18 1600  BP: 134/79 134/79  Pulse: 74 74  Resp:  14  Temp: 36.8 C 36.8 C  SpO2: 99% 99%    Last Pain:  Vitals:   01/14/18 1613  TempSrc:   PainSc: 3                  Lidia Collum

## 2018-01-14 NOTE — Interval H&P Note (Signed)
History and Physical Interval Note:  01/14/2018 10:04 AM  Sherry Robinson  has presented today for surgery, with the diagnosis of Right hip osteoarthritis  The various methods of treatment have been discussed with the patient and family. After consideration of risks, benefits and other options for treatment, the patient has consented to  Procedure(s) with comments: RIGHT TOTAL HIP ARTHROPLASTY ANTERIOR APPROACH (Right) - 70 mins as a surgical intervention .  The patient's history has been reviewed, patient examined, no change in status, stable for surgery.  I have reviewed the patient's chart and labs.  Questions were answered to the patient's satisfaction.     Mauri Pole

## 2018-01-14 NOTE — Anesthesia Procedure Notes (Signed)
Spinal  Patient location during procedure: OR Start time: 01/14/2018 11:08 AM End time: 01/14/2018 11:25 AM Staffing Anesthesiologist: Lidia Collum, MD Resident/CRNA: Gwyndolyn Saxon, CRNA Performed: resident/CRNA and anesthesiologist  Preanesthetic Checklist Completed: patient identified, site marked, surgical consent, pre-op evaluation, timeout performed, IV checked, risks and benefits discussed and monitors and equipment checked Spinal Block Patient position: sitting Prep: Betadine Patient monitoring: heart rate and continuous pulse ox Approach: midline Needle Needle type: Pencan  Needle gauge: 24 G Needle length: 9 cm Assessment Events: failed spinal Additional Notes CRNA attempt spinal at L3-L4; unable to obtain CSF; MDA to attempt spinal at L2-L3; unable to obtain CSF

## 2018-01-14 NOTE — Anesthesia Procedure Notes (Signed)
Procedure Name: LMA Insertion Date/Time: 01/14/2018 11:36 AM Performed by: Gwyndolyn Saxon, CRNA Pre-anesthesia Checklist: Patient identified, Emergency Drugs available, Suction available and Patient being monitored Patient Re-evaluated:Patient Re-evaluated prior to induction Oxygen Delivery Method: Circle system utilized Preoxygenation: Pre-oxygenation with 100% oxygen Induction Type: IV induction Ventilation: Mask ventilation without difficulty LMA: LMA with gastric port inserted LMA Size: 5.0 Number of attempts: 1 Airway Equipment and Method: Patient positioned with wedge pillow Placement Confirmation: positive ETCO2 and breath sounds checked- equal and bilateral Tube secured with: Tape Dental Injury: Teeth and Oropharynx as per pre-operative assessment

## 2018-01-14 NOTE — Plan of Care (Signed)
Plan of care 

## 2018-01-15 ENCOUNTER — Encounter (HOSPITAL_COMMUNITY): Payer: Self-pay | Admitting: Orthopedic Surgery

## 2018-01-15 LAB — BASIC METABOLIC PANEL
ANION GAP: 10 (ref 5–15)
BUN: 17 mg/dL (ref 8–23)
CALCIUM: 9.2 mg/dL (ref 8.9–10.3)
CO2: 25 mmol/L (ref 22–32)
Chloride: 102 mmol/L (ref 98–111)
Creatinine, Ser: 1.19 mg/dL — ABNORMAL HIGH (ref 0.44–1.00)
GFR calc Af Amer: 54 mL/min — ABNORMAL LOW (ref >60)
GFR, EST NON AFRICAN AMERICAN: 46 mL/min — AB (ref >60)
GLUCOSE: 133 mg/dL — AB (ref 70–99)
POTASSIUM: 4.2 mmol/L (ref 3.5–5.1)
Sodium: 137 mmol/L (ref 135–145)

## 2018-01-15 LAB — CBC
HEMATOCRIT: 30.8 % — AB (ref 36.0–46.0)
Hemoglobin: 9.7 g/dL — ABNORMAL LOW (ref 12.0–15.0)
MCH: 28.7 pg (ref 26.0–34.0)
MCHC: 31.5 g/dL (ref 30.0–36.0)
MCV: 91.1 fL (ref 80.0–100.0)
PLATELETS: 124 10*3/uL — AB (ref 150–400)
RBC: 3.38 MIL/uL — ABNORMAL LOW (ref 3.87–5.11)
RDW: 12.6 % (ref 11.5–15.5)
WBC: 9.2 10*3/uL (ref 4.0–10.5)
nRBC: 0 % (ref 0.0–0.2)

## 2018-01-15 NOTE — Evaluation (Signed)
Physical Therapy Evaluation Patient Details Name: Sherry Robinson MRN: [id number] DOB: June 23, 1950 Today's Date: 01/15/2018   History of Present Illness  pt s/p R THR and with hx of DA THA on Left on 10/29/2017.   Clinical Impression  Pt s/p R THR and presents with decreased R LE strength/ROM and post op pain limiting functional mobility.  Pt should progress to dc home with family assist.    Follow Up Recommendations Follow surgeon's recommendation for DC plan and follow-up therapies    Equipment Recommendations  None recommended by PT    Recommendations for Other Services       Precautions / Restrictions Precautions Precautions: Fall Restrictions Weight Bearing Restrictions: No Other Position/Activity Restrictions: WBAT      Mobility  Bed Mobility Overal bed mobility: Needs Assistance Bed Mobility: Supine to Sit     Supine to sit: Min assist     General bed mobility comments: cues for sequence and use of L LE to self assist  Transfers Overall transfer level: Needs assistance Equipment used: Rolling walker (2 wheeled) Transfers: Sit to/from Stand Sit to Stand: Min assist         General transfer comment: cues for LE management and use of UEs to self assist  Ambulation/Gait Ambulation/Gait assistance: Min assist;Min guard Gait Distance (Feet): 150 Feet Assistive device: Rolling walker (2 wheeled) Gait Pattern/deviations: Step-to pattern;Step-through pattern;Decreased step length - right;Decreased step length - left;Shuffle;Trunk flexed Gait velocity: decr   General Gait Details: cues for sequence, posture and position from [employer]            Wheelchair Mobility    Modified Rankin (Stroke Patients Only)       Balance Overall balance assessment: Mild deficits observed, not formally tested                                           Pertinent Vitals/Pain Pain Assessment: 0-10 Pain Score: 5  Pain Location: R hip Pain  Descriptors / Indicators: Aching;Sore Pain Intervention(s): Limited activity within patient's tolerance;Monitored during session;Premedicated before session;Ice applied    Home Living Family/patient expects to be discharged to:: Private residence Living Arrangements: Alone Available Help at Discharge: Available PRN/intermittently Type of Home: House Home Access: Level entry     Home Layout: One level Home Equipment: Environmental consultant - 2 wheels;Bedside commode Additional Comments: pt lives in [place] and family lives in [place]. Family works in [employer].     Prior Function Level of Independence: Independent               Hand Dominance        Extremity/Trunk Assessment   Upper Extremity Assessment Upper Extremity Assessment: Overall WFL for tasks assessed    Lower Extremity Assessment Lower Extremity Assessment: RLE deficits/detail RLE Deficits / Details: Strength at hip 2+/5 with AAROM at hip to 85 flex and 15 abd    Cervical / Trunk Assessment Cervical / Trunk Assessment: Normal  Communication   Communication: No difficulties  Cognition Arousal/Alertness: Awake/alert Behavior During Therapy: WFL for tasks assessed/performed Overall Cognitive Status: Within Functional Limits for tasks assessed                                        General Comments      Exercises Total Joint Exercises  Ankle Circles/Pumps: AROM;Both;20 reps;Supine Quad Sets: AROM;Both;10 reps;Supine Heel Slides: AAROM;Right;20 reps;Supine Hip ABduction/ADduction: AAROM;Right;15 reps;Supine   Assessment/Plan    PT Assessment Patient needs continued PT services  PT Problem List Decreased strength;Decreased range of motion;Decreased activity tolerance;Decreased mobility;Decreased knowledge of use of DME;Pain;Obesity       PT Treatment Interventions DME instruction;Gait training;Stair training;Functional mobility training;Therapeutic activities;Therapeutic  exercise;Patient/family education    PT Goals (Current goals can be found in the Care Plan section)  Acute Rehab PT Goals Patient Stated Goal: Regain IND PT Goal Formulation: With patient Time For Goal Achievement: 01/22/18 Potential to Achieve Goals: Good    Frequency 7X/week   Barriers to discharge        Co-evaluation               AM-PAC PT "6 Clicks" Daily Activity  Outcome Measure Difficulty turning over in bed (including adjusting bedclothes, sheets and blankets)?: Unable Difficulty moving from lying on back to sitting on the side of the bed? : Unable Difficulty sitting down on and standing up from a chair with arms (e.g., wheelchair, bedside commode, etc,.)?: Unable Help needed moving to and from a bed to chair (including a wheelchair)?: A Little Help needed walking in hospital room?: A Little Help needed climbing 3-5 steps with a railing? : A Little 6 Click Score: 12    End of Session Equipment Utilized During Treatment: Gait belt Activity Tolerance: Patient tolerated treatment well Patient left: in chair;with call bell/phone within reach Nurse Communication: Mobility status PT Visit Diagnosis: Difficulty in walking, not elsewhere classified (R26.2)    Time: 7948-0165 PT Time Calculation (min) (ACUTE ONLY): 28 min   Charges:   PT Evaluation $PT Eval Low Complexity: 1 Low PT Treatments $Therapeutic Exercise: 8-22 mins        Debe Coder PT Acute Rehabilitation Services Pager [phone] Office [phone]   Rosary Filosa 01/15/2018, 12:53 PM

## 2018-01-15 NOTE — Progress Notes (Signed)
Physical Therapy Treatment Patient Details Name: Sherry Robinson MRN: [id number] DOB: 1950/10/16 Today's Date: 01/15/2018    History of Present Illness pt s/p R THR and with hx of DA THA on Left on 10/29/2017.     PT Comments    Pt progressing well with mobility.  Reviewed stairs, car transfers, and home therex program with written instruction provided.   Follow Up Recommendations  Follow surgeon's recommendation for DC plan and follow-up therapies     Equipment Recommendations  None recommended by PT    Recommendations for Other Services       Precautions / Restrictions Precautions Precautions: Fall Restrictions Weight Bearing Restrictions: No Other Position/Activity Restrictions: WBAT    Mobility  Bed Mobility               General bed mobility comments: Pt up in chair and requests back to same  Transfers Overall transfer level: Needs assistance Equipment used: Rolling walker (2 wheeled) Transfers: Sit to/from Stand Sit to Stand: Min guard;Supervision         General transfer comment: cues for LE management and use of UEs to self assist  Ambulation/Gait Ambulation/Gait assistance: Min guard;Supervision Gait Distance (Feet): 100 Feet Assistive device: Rolling walker (2 wheeled) Gait Pattern/deviations: Step-to pattern;Step-through pattern;Decreased step length - right;Decreased step length - left;Shuffle;Trunk flexed Gait velocity: decr   General Gait Details: cues for sequence, posture and position from RW   Stairs Stairs: Yes Stairs assistance: Min guard Stair Management: No rails;Step to pattern;Backwards;With walker Number of Stairs: 2 General stair comments: single step twice bkwd to practice stool into high bed   Wheelchair Mobility    Modified Rankin (Stroke Patients Only)       Balance Overall balance assessment: Mild deficits observed, not formally tested                                          Cognition  Arousal/Alertness: Awake/alert Behavior During Therapy: WFL for tasks assessed/performed Overall Cognitive Status: Within Functional Limits for tasks assessed                                        Exercises Total Joint Exercises Ankle Circles/Pumps: AROM;Both;20 reps;Supine Quad Sets: AROM;Both;10 reps;Supine Heel Slides: AAROM;Right;20 reps;Supine Hip ABduction/ADduction: AAROM;Right;15 reps;Supine Long Arc Quad: AROM;Right;10 reps;Seated    General Comments        Pertinent Vitals/Pain Pain Assessment: 0-10 Pain Score: 4  Pain Location: R hip Pain Descriptors / Indicators: Aching;Sore Pain Intervention(s): Limited activity within patient's tolerance;Monitored during session;Premedicated before session;Ice applied    Home Living                      Prior Function            PT Goals (current goals can now be found in the care plan section) Acute Rehab PT Goals Patient Stated Goal: Regain IND PT Goal Formulation: With patient Time For Goal Achievement: 01/22/18 Potential to Achieve Goals: Good Progress towards PT goals: Progressing toward goals    Frequency    7X/week      PT Plan Current plan remains appropriate    Co-evaluation              AM-PAC PT "6 Clicks" Daily Activity  Outcome Measure  Difficulty turning over in bed (including adjusting bedclothes, sheets and blankets)?: Unable Difficulty moving from lying on back to sitting on the side of the bed? : Unable Difficulty sitting down on and standing up from a chair with arms (e.g., wheelchair, bedside commode, etc,.)?: Unable Help needed moving to and from a bed to chair (including a wheelchair)?: A Little Help needed walking in hospital room?: A Little Help needed climbing 3-5 steps with a railing? : A Little 6 Click Score: 12    End of Session Equipment Utilized During Treatment: Gait belt Activity Tolerance: Patient tolerated treatment well Patient left: in  chair;with call bell/phone within reach Nurse Communication: Mobility status PT Visit Diagnosis: Difficulty in walking, not elsewhere classified (R26.2)     Time: 1430-1500 PT Time Calculation (min) (ACUTE ONLY): 30 min  Charges:  $Gait Training: 8-22 mins $Therapeutic Exercise: 8-22 mins                     [place] Pager [phone] Office [phone]    Miia Blanks 01/15/2018, 4:32 PM

## 2018-01-15 NOTE — Care Management Note (Signed)
Case Management Note  Patient Details  Name: LALENA SALAS MRN: [id number] Date of Birth: 04-20-1950  Subjective/Objective: From home with support, has dme. No CM needs.d/c plan HEP.                   Action/Plan:dc home.   Expected Discharge Date:  01/15/18               Expected Discharge Plan:  Home/Self Care  In-House Referral:     Discharge planning Services  CM Consult  Post Acute Care Choice:    Choice offered to:     DME Arranged:    DME Agency:     HH Arranged:    HH Agency:     Status of Service:  Completed, signed off  If discussed at [employer] of Stay Meetings, dates discussed:    Additional Comments:  Dessa Phi, RN 01/15/2018, 12:19 PM

## 2018-01-15 NOTE — Progress Notes (Signed)
Patient ID: Sherry Robinson, female   DOB: [birth year], 67 y.o.   MRN: [id number] Subjective: 1 Day Post-Op Procedure(s) (LRB): RIGHT TOTAL HIP ARTHROPLASTY ANTERIOR APPROACH (Right)    Patient reports pain as mild to moderate depending on activity.  No events  Objective:   VITALS:   Vitals:   01/15/18 0220 01/15/18 0618  BP: (!) 150/69 105/61  Pulse: 65 73  Resp: 18 18  Temp: 98.6 F (37 C) 99.1 F (37.3 C)  SpO2: 100% 95%    Neurovascular intact Incision: dressing C/D/I  LABS Recent Labs    01/15/18 0500  HGB 9.7*  HCT 30.8*  WBC 9.2  PLT 124*    Recent Labs    01/15/18 0500  NA 137  K 4.2  BUN 17  CREATININE 1.19*  GLUCOSE 133*    No results for input(s): LABPT, INR in the last 72 hours.   Assessment/Plan: 1 Day Post-Op Procedure(s) (LRB): RIGHT TOTAL HIP ARTHROPLASTY ANTERIOR APPROACH (Right)   Advance diet Up with therapy  Will plan for home discharge today after therapy works with her RTC in 2 weeks

## 2018-01-20 NOTE — Discharge Summary (Signed)
Physician Discharge Summary  Patient ID: Sherry Robinson MRN: [id number] DOB/AGE: 1951-01-12 67 y.o.  Admit date: 01/14/2018 Discharge date: 01/15/2018   Procedures:  Procedure(s) (LRB): RIGHT TOTAL HIP ARTHROPLASTY ANTERIOR APPROACH (Right)  Attending Physician:  Dr. Paralee Cancel   Admission Diagnoses:   Right hip primary OA / pain  Discharge Diagnoses:  Principal Problem:   S/P right THA, AA Active Problems:   S/P left THA, AA  Past Medical History:  Diagnosis Date  . Arthritis    oa  . Chronic kidney disease (CKD), stage III (moderate) (HCC)   . Hypercholesterolemia with hypertriglyceridemia   . Hypertension   . Metabolic syndrome   . Morbid obesity (HCC)     HPI:    Sherry Robinson, 67 y.o. female, has a history of pain and functional disability in the right hip(s) due to arthritis and patient has failed non-surgical conservative treatments for greater than 12 weeks to include NSAID's and/or analgesics and activity modification.  Onset of symptoms was gradual starting 2+ years ago with gradually worsening course since that time.The patient noted prior procedures of the hip to include arthroplasty on the left hip 12/29/17.  Patient currently rates pain in the right hip at 8 out of 10 with activity. Patient has worsening of pain with activity and weight bearing, trendelenberg gait, pain that interfers with activities of daily living and pain with passive range of motion. Patient has evidence of periarticular osteophytes and joint space narrowing by imaging studies. This condition presents safety issues increasing the risk of falls. There is no current active infection.  Risks, benefits and expectations were discussed with the patient.  Risks including but not limited to the risk of anesthesia, blood clots, nerve damage, blood vessel damage, failure of the prosthesis, infection and up to and including death.  Patient understand the risks, benefits and expectations and wishes to  proceed with surgery.  PCP: [facility]   Discharged Condition: good  Hospital Course:  Patient underwent the above stated procedure on 01/14/2018. Patient tolerated the procedure well and brought to the recovery room in good condition and subsequently to the floor.  POD #1 BP: 105/61 ; Pulse: 73 ; Temp: 99.1 F (37.3 C) ; Resp: 18 Patient reports pain as mild to moderate depending on activity.  No events. Neurovascular intact and incision: dressing C/D/I.   LABS  Basename    HGB     9.7  HCT     30.8    Discharge Exam: General appearance: alert, cooperative and no distress Extremities: Homans sign is negative, no sign of DVT, no edema, redness or tenderness in the calves or thighs and no ulcers, gangrene or trophic changes  Disposition:  Home with follow up in 2 weeks   Follow-up Information    Paralee Cancel, MD. Schedule an appointment as soon as possible for a visit in 2 weeks.   Specialty:  Orthopedic Surgery Contact information: [address] [place] [address] [phone]           Discharge Instructions    Call MD / Call 911   Complete by:  As directed    If you experience chest pain or shortness of breath, CALL 911 and be transported to the hospital emergency room.  If you develope a fever above 101 F, pus (white drainage) or increased drainage or redness at the wound, or calf pain, call your surgeon's office.   Change dressing   Complete by:  As  directed    Maintain surgical dressing until follow up in the clinic. If the edges start to pull up, may reinforce with tape. If the dressing is no longer working, may remove and cover with gauze and tape, but must keep the area dry and clean.  Call with any questions or concerns.   Constipation Prevention   Complete by:  As directed    Drink plenty of fluids.  Prune juice may be helpful.  You may use a stool softener, such as Colace (over the counter) 100 mg twice a day.  Use  MiraLax (over the counter) for constipation as needed.   Diet - low sodium heart healthy   Complete by:  As directed    Discharge instructions   Complete by:  As directed    Maintain surgical dressing until follow up in the clinic. If the edges start to pull up, may reinforce with tape. If the dressing is no longer working, may remove and cover with gauze and tape, but must keep the area dry and clean.  Follow up in 2 weeks at [facility]. Call with any questions or concerns.   Increase activity slowly as tolerated   Complete by:  As directed    Weight bearing as tolerated with assist device (walker, cane, etc) as directed, use it as long as suggested by your surgeon or therapist, typically at least 4-6 weeks.   TED hose   Complete by:  As directed    Use stockings (TED hose) for 2 weeks on both leg(s).  You may remove them at night for sleeping.      Allergies as of 01/15/2018      Reactions   Lisinopril Cough      Medication List    STOP taking these medications   acetaminophen 500 MG tablet Commonly known as:  TYLENOL   ibuprofen 800 MG tablet Commonly known as:  ADVIL,MOTRIN     TAKE these medications   aspirin 81 MG chewable tablet Chew 1 tablet (81 mg total) by mouth 2 (two) times daily. Take for 4 weeks, then resume regular dose.   cholecalciferol 1000 units tablet Commonly known as:  VITAMIN D Take 1,000 Units by mouth daily.   docusate sodium 100 MG capsule Commonly known as:  COLACE Take 1 capsule (100 mg total) by mouth 2 (two) times daily.   ferrous sulfate 325 (65 FE) MG tablet Take 1 tablet (325 mg total) by mouth 3 (three) times daily with meals.   HYDROcodone-acetaminophen 7.5-325 MG tablet Commonly known as:  NORCO Take 1-2 tablets by mouth every 4 (four) hours as needed for moderate pain.   HYDROcodone-acetaminophen 7.5-325 MG tablet Commonly known as:  NORCO Take 1-2 tablets by mouth every 4 (four) hours as needed for moderate pain.     losartan-hydrochlorothiazide 100-12.5 MG tablet Commonly known as:  HYZAAR Take 1 tablet by mouth daily.   methocarbamol 500 MG tablet Commonly known as:  ROBAXIN Take 1 tablet (500 mg total) by mouth every 6 (six) hours as needed for muscle spasms.   multivitamin with minerals tablet Take 1 tablet by mouth daily.   polyethylene glycol packet Commonly known as:  MIRALAX / GLYCOLAX Take 17 g by mouth 2 (two) times daily.   rosuvastatin 20 MG tablet Commonly known as:  CRESTOR Take 20 mg by mouth daily.            Discharge Care Instructions  (From admission, onward)  Start     Ordered   01/15/18 0000  Change dressing    Comments:  Maintain surgical dressing until follow up in the clinic. If the edges start to pull up, may reinforce with tape. If the dressing is no longer working, may remove and cover with gauze and tape, but must keep the area dry and clean.  Call with any questions or concerns.   01/15/18 0810           Signed: West Pugh. Miles Borkowski   PA-C  01/20/2018, 1:12 PM

## 2018-03-05 DIAGNOSIS — Z471 Aftercare following joint replacement surgery: Secondary | ICD-10-CM | POA: Diagnosis not present

## 2018-03-05 DIAGNOSIS — Z96641 Presence of right artificial hip joint: Secondary | ICD-10-CM | POA: Diagnosis not present

## 2018-03-14 DIAGNOSIS — Z1231 Encounter for screening mammogram for malignant neoplasm of breast: Secondary | ICD-10-CM | POA: Diagnosis not present

## 2018-05-08 DIAGNOSIS — M25511 Pain in right shoulder: Secondary | ICD-10-CM | POA: Diagnosis not present

## 2018-05-08 DIAGNOSIS — M7581 Other shoulder lesions, right shoulder: Secondary | ICD-10-CM | POA: Diagnosis not present

## 2018-05-08 DIAGNOSIS — E669 Obesity, unspecified: Secondary | ICD-10-CM | POA: Diagnosis not present

## 2018-05-08 DIAGNOSIS — M2550 Pain in unspecified joint: Secondary | ICD-10-CM | POA: Diagnosis not present

## 2018-05-08 DIAGNOSIS — Z6839 Body mass index (BMI) 39.0-39.9, adult: Secondary | ICD-10-CM | POA: Diagnosis not present

## 2018-05-19 DIAGNOSIS — Z0000 Encounter for general adult medical examination without abnormal findings: Secondary | ICD-10-CM | POA: Diagnosis not present

## 2018-05-19 DIAGNOSIS — Z1339 Encounter for screening examination for other mental health and behavioral disorders: Secondary | ICD-10-CM | POA: Diagnosis not present

## 2018-05-19 DIAGNOSIS — N3091 Cystitis, unspecified with hematuria: Secondary | ICD-10-CM | POA: Diagnosis not present

## 2018-06-03 DIAGNOSIS — M25511 Pain in right shoulder: Secondary | ICD-10-CM | POA: Diagnosis not present

## 2018-06-03 DIAGNOSIS — M19011 Primary osteoarthritis, right shoulder: Secondary | ICD-10-CM | POA: Diagnosis not present

## 2018-06-17 DIAGNOSIS — M19011 Primary osteoarthritis, right shoulder: Secondary | ICD-10-CM | POA: Diagnosis not present

## 2018-06-17 DIAGNOSIS — M25511 Pain in right shoulder: Secondary | ICD-10-CM | POA: Diagnosis not present

## 2019-04-18 IMAGING — DX DG PORTABLE PELVIS
1 series · 1 of 1 positions shown · non-contrast
Comparison: Intraoperative images obtained earlier in the day;
prior study July 13, 2013

CLINICAL DATA: Status post total hip replacement

EXAM:
PORTABLE PELVIS 1-2 VIEWS

[pelvis ap]
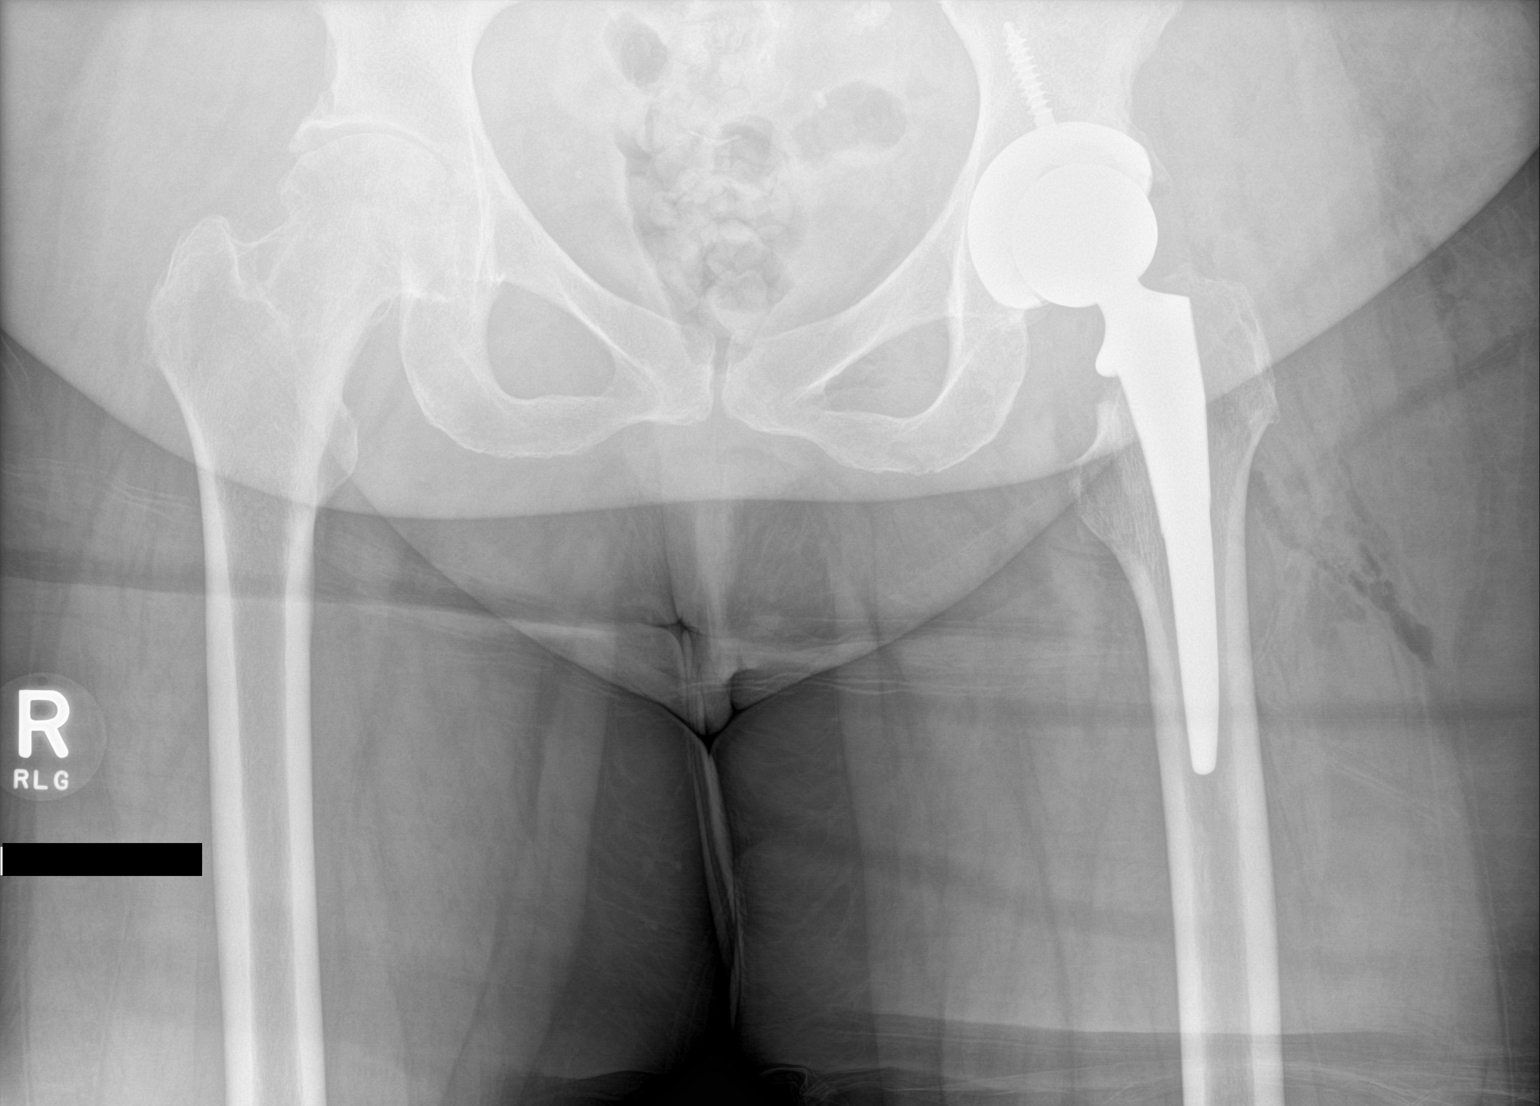

[1 of 1 positions shown; findings below may reference images not displayed]

FINDINGS: Frontal view of lower pelvis and hips obtained. There is a total hip
replacement on the left with prosthetic components well-seated. No
acute fracture or dislocation evident. There is moderate narrowing
of the right hip joint. Soft tissue air on the left is an expected
postoperative finding.
IMPRESSION: Status post total hip replacement on the left with prosthetic
components well-seated. No fracture or dislocation. Moderate
osteoarthritic change involving the right hip joint.

## 2019-05-27 DIAGNOSIS — Z1231 Encounter for screening mammogram for malignant neoplasm of breast: Secondary | ICD-10-CM | POA: Diagnosis not present

## 2019-06-04 DIAGNOSIS — Z0000 Encounter for general adult medical examination without abnormal findings: Secondary | ICD-10-CM | POA: Diagnosis not present

## 2019-06-04 DIAGNOSIS — Z79899 Other long term (current) drug therapy: Secondary | ICD-10-CM | POA: Diagnosis not present

## 2019-06-04 DIAGNOSIS — E782 Mixed hyperlipidemia: Secondary | ICD-10-CM | POA: Diagnosis not present

## 2019-06-04 DIAGNOSIS — Z780 Asymptomatic menopausal state: Secondary | ICD-10-CM | POA: Diagnosis not present

## 2019-06-04 DIAGNOSIS — N183 Chronic kidney disease, stage 3 unspecified: Secondary | ICD-10-CM | POA: Diagnosis not present

## 2019-06-17 DIAGNOSIS — I714 Abdominal aortic aneurysm, without rupture: Secondary | ICD-10-CM | POA: Diagnosis not present

## 2019-06-19 DIAGNOSIS — R946 Abnormal results of thyroid function studies: Secondary | ICD-10-CM | POA: Diagnosis not present

## 2019-06-19 DIAGNOSIS — D473 Essential (hemorrhagic) thrombocythemia: Secondary | ICD-10-CM | POA: Diagnosis not present

## 2019-06-19 DIAGNOSIS — R0981 Nasal congestion: Secondary | ICD-10-CM | POA: Diagnosis not present

## 2019-06-19 DIAGNOSIS — H6122 Impacted cerumen, left ear: Secondary | ICD-10-CM | POA: Diagnosis not present

## 2019-06-19 DIAGNOSIS — D696 Thrombocytopenia, unspecified: Secondary | ICD-10-CM | POA: Diagnosis not present

## 2019-06-19 DIAGNOSIS — J342 Deviated nasal septum: Secondary | ICD-10-CM | POA: Diagnosis not present

## 2019-06-19 DIAGNOSIS — D529 Folate deficiency anemia, unspecified: Secondary | ICD-10-CM | POA: Diagnosis not present

## 2019-06-19 DIAGNOSIS — Z9889 Other specified postprocedural states: Secondary | ICD-10-CM | POA: Diagnosis not present

## 2019-06-19 DIAGNOSIS — D519 Vitamin B12 deficiency anemia, unspecified: Secondary | ICD-10-CM | POA: Diagnosis not present

## 2019-06-19 DIAGNOSIS — Z86018 Personal history of other benign neoplasm: Secondary | ICD-10-CM | POA: Diagnosis not present

## 2019-07-04 IMAGING — RF DG HIP (WITH PELVIS) OPERATIVE*R*
1 series · 2 of 2 positions shown · non-contrast
Comparison: 10/29/2017.

CLINICAL DATA: 67-year-old female. Right hip replacement.
Osteoarthritis. Subsequent encounter.

EXAM:
OPERATIVE right HIP (WITH PELVIS IF PERFORMED) 2 VIEWS
TECHNIQUE: Fluoroscopic spot image(s) were submitted for interpretation
post-operatively.
Fluoroscopic time: 19 seconds.

[Series 1: unknown protocol · 0.20mm/px · 2 of 2 slices shown]
[im 1/2]
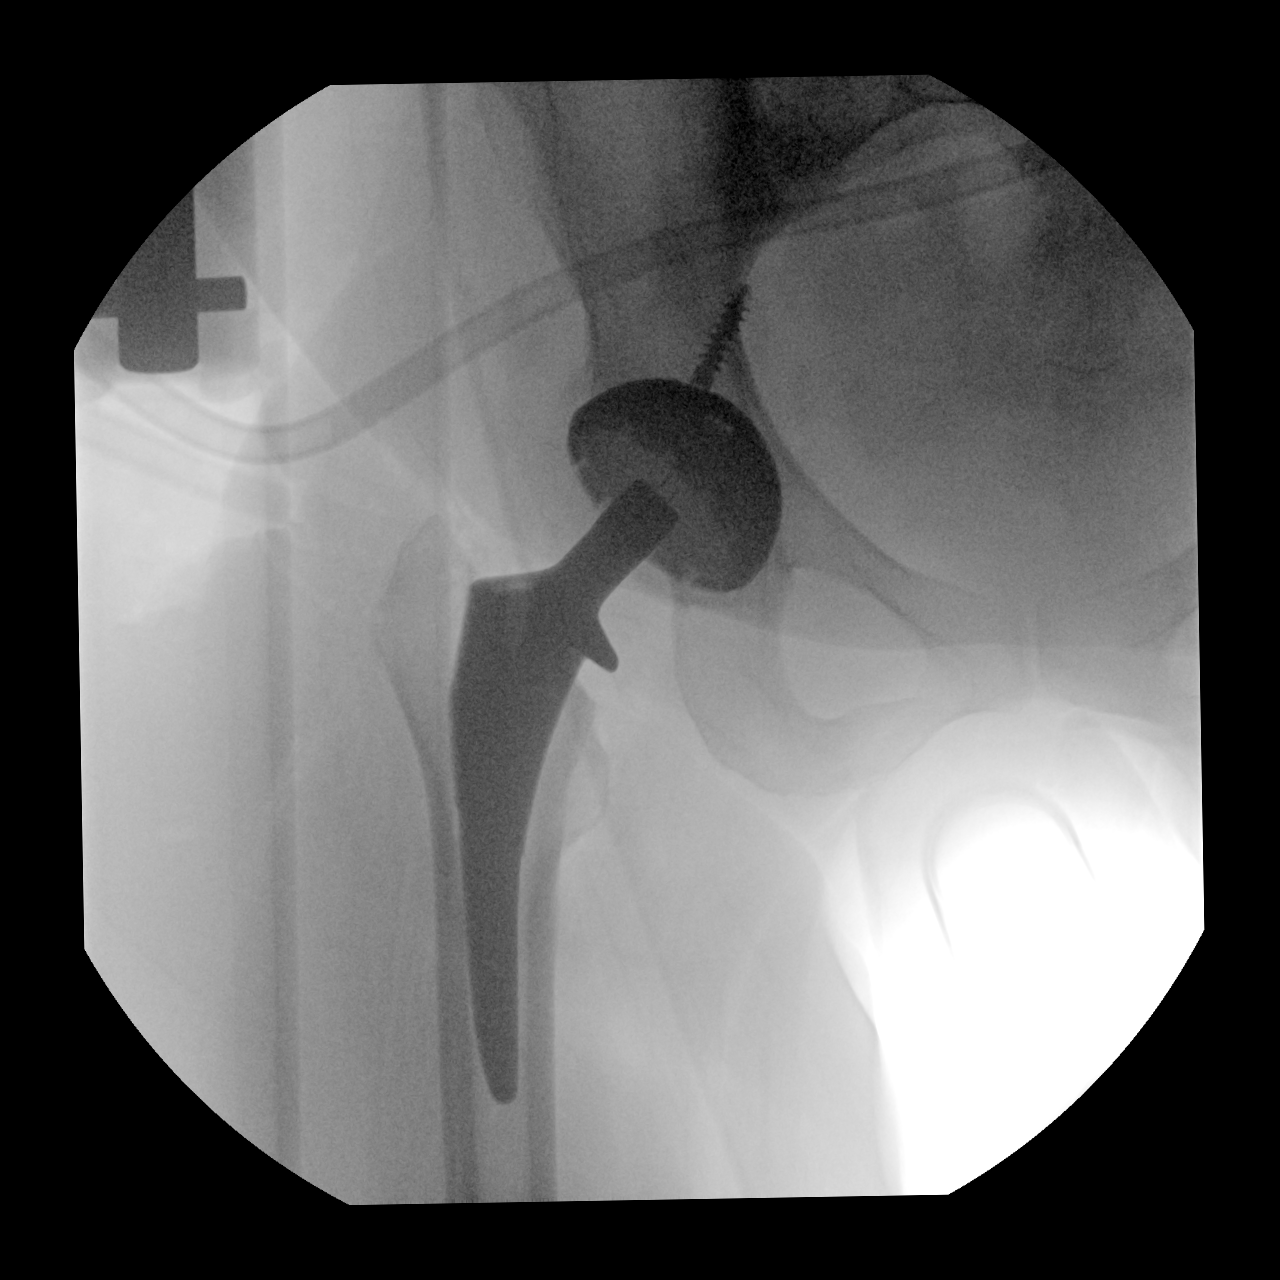
[im 2/2]
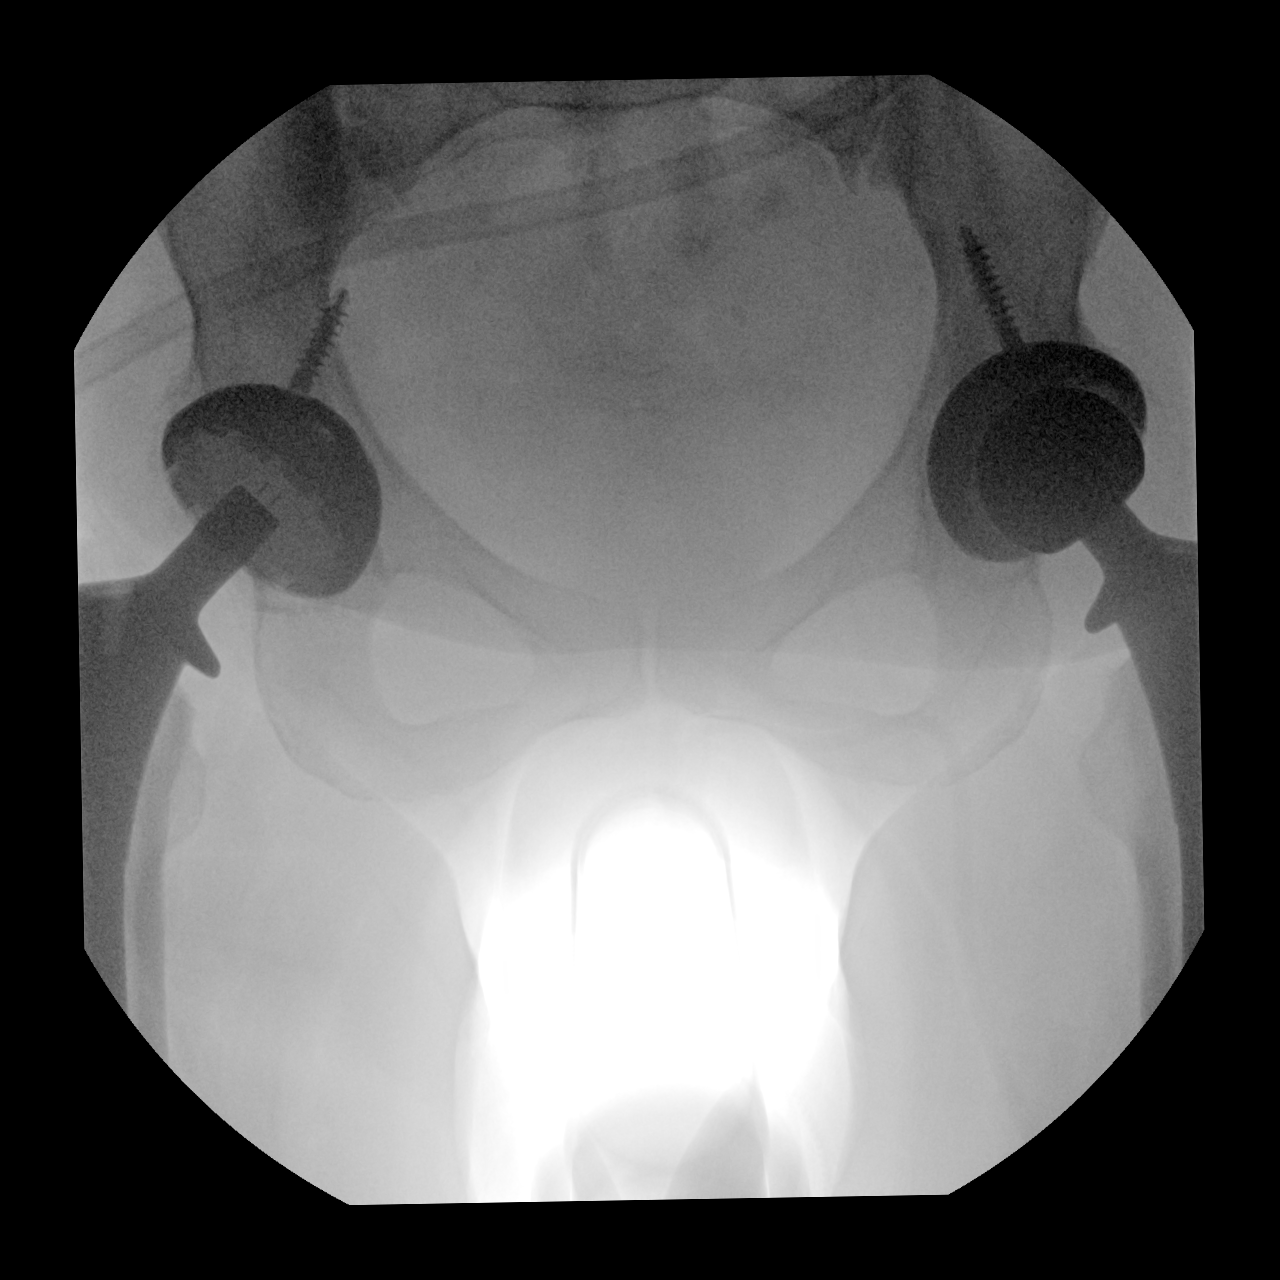

[2 of 2 positions shown; findings below may reference images not displayed]

FINDINGS: Two intraoperative C-arm views submitted for review after surgery.
This reveals right hip prosthesis placed. Acetabular screw crosses
into the pelvis. Femoral head component not in place.

Remote left hip replacement.
IMPRESSION: Placement right hip prosthesis. Acetabular screw crosses into the
pelvis.

## 2019-09-24 DIAGNOSIS — H905 Unspecified sensorineural hearing loss: Secondary | ICD-10-CM | POA: Diagnosis not present

## 2020-01-08 DIAGNOSIS — N183 Chronic kidney disease, stage 3 unspecified: Secondary | ICD-10-CM | POA: Diagnosis not present

## 2020-01-08 DIAGNOSIS — I10 Essential (primary) hypertension: Secondary | ICD-10-CM | POA: Diagnosis not present

## 2020-01-08 DIAGNOSIS — E782 Mixed hyperlipidemia: Secondary | ICD-10-CM | POA: Diagnosis not present

## 2020-01-08 DIAGNOSIS — F17201 Nicotine dependence, unspecified, in remission: Secondary | ICD-10-CM | POA: Diagnosis not present

## 2020-02-09 DIAGNOSIS — L578 Other skin changes due to chronic exposure to nonionizing radiation: Secondary | ICD-10-CM | POA: Diagnosis not present

## 2020-02-09 DIAGNOSIS — L814 Other melanin hyperpigmentation: Secondary | ICD-10-CM | POA: Diagnosis not present

## 2020-02-09 DIAGNOSIS — L910 Hypertrophic scar: Secondary | ICD-10-CM | POA: Diagnosis not present

## 2020-06-06 DIAGNOSIS — Z0000 Encounter for general adult medical examination without abnormal findings: Secondary | ICD-10-CM | POA: Diagnosis not present

## 2020-06-06 DIAGNOSIS — I10 Essential (primary) hypertension: Secondary | ICD-10-CM | POA: Diagnosis not present

## 2020-06-06 DIAGNOSIS — Z131 Encounter for screening for diabetes mellitus: Secondary | ICD-10-CM | POA: Diagnosis not present

## 2020-06-06 DIAGNOSIS — Z9181 History of falling: Secondary | ICD-10-CM | POA: Diagnosis not present

## 2020-06-06 DIAGNOSIS — N183 Chronic kidney disease, stage 3 unspecified: Secondary | ICD-10-CM | POA: Diagnosis not present

## 2020-06-06 DIAGNOSIS — Z79899 Other long term (current) drug therapy: Secondary | ICD-10-CM | POA: Diagnosis not present

## 2020-06-06 DIAGNOSIS — E782 Mixed hyperlipidemia: Secondary | ICD-10-CM | POA: Diagnosis not present

## 2020-06-06 DIAGNOSIS — F17201 Nicotine dependence, unspecified, in remission: Secondary | ICD-10-CM | POA: Diagnosis not present

## 2020-12-13 DIAGNOSIS — N183 Chronic kidney disease, stage 3 unspecified: Secondary | ICD-10-CM | POA: Diagnosis not present

## 2020-12-13 DIAGNOSIS — I10 Essential (primary) hypertension: Secondary | ICD-10-CM | POA: Diagnosis not present

## 2020-12-13 DIAGNOSIS — D696 Thrombocytopenia, unspecified: Secondary | ICD-10-CM | POA: Diagnosis not present

## 2020-12-13 DIAGNOSIS — E782 Mixed hyperlipidemia: Secondary | ICD-10-CM | POA: Diagnosis not present

## 2021-07-14 DIAGNOSIS — T753XXD Motion sickness, subsequent encounter: Secondary | ICD-10-CM | POA: Diagnosis not present

## 2021-07-14 DIAGNOSIS — Z79899 Other long term (current) drug therapy: Secondary | ICD-10-CM | POA: Diagnosis not present

## 2021-07-14 DIAGNOSIS — M1830 Unilateral post-traumatic osteoarthritis of first carpometacarpal joint, unspecified hand: Secondary | ICD-10-CM | POA: Diagnosis not present

## 2021-07-14 DIAGNOSIS — Z780 Asymptomatic menopausal state: Secondary | ICD-10-CM | POA: Diagnosis not present

## 2021-07-14 DIAGNOSIS — J302 Other seasonal allergic rhinitis: Secondary | ICD-10-CM | POA: Diagnosis not present

## 2021-07-14 DIAGNOSIS — Z0000 Encounter for general adult medical examination without abnormal findings: Secondary | ICD-10-CM | POA: Diagnosis not present

## 2021-07-14 DIAGNOSIS — E782 Mixed hyperlipidemia: Secondary | ICD-10-CM | POA: Diagnosis not present

## 2021-10-16 DIAGNOSIS — J309 Allergic rhinitis, unspecified: Secondary | ICD-10-CM | POA: Diagnosis not present

## 2021-10-16 DIAGNOSIS — R0981 Nasal congestion: Secondary | ICD-10-CM | POA: Diagnosis not present

## 2021-10-16 DIAGNOSIS — R0982 Postnasal drip: Secondary | ICD-10-CM | POA: Diagnosis not present

## 2021-11-28 DIAGNOSIS — H905 Unspecified sensorineural hearing loss: Secondary | ICD-10-CM | POA: Diagnosis not present

## 2021-12-29 DIAGNOSIS — G629 Polyneuropathy, unspecified: Secondary | ICD-10-CM | POA: Diagnosis not present

## 2022-01-12 DIAGNOSIS — N1831 Chronic kidney disease, stage 3a: Secondary | ICD-10-CM | POA: Diagnosis not present

## 2022-01-12 DIAGNOSIS — J309 Allergic rhinitis, unspecified: Secondary | ICD-10-CM | POA: Diagnosis not present

## 2022-01-12 DIAGNOSIS — R0982 Postnasal drip: Secondary | ICD-10-CM | POA: Diagnosis not present

## 2022-01-12 DIAGNOSIS — I10 Essential (primary) hypertension: Secondary | ICD-10-CM | POA: Diagnosis not present

## 2022-01-12 DIAGNOSIS — D696 Thrombocytopenia, unspecified: Secondary | ICD-10-CM | POA: Diagnosis not present

## 2022-01-12 DIAGNOSIS — E782 Mixed hyperlipidemia: Secondary | ICD-10-CM | POA: Diagnosis not present

## 2022-01-12 DIAGNOSIS — Z2821 Immunization not carried out because of patient refusal: Secondary | ICD-10-CM | POA: Diagnosis not present

## 2022-01-31 DIAGNOSIS — N3001 Acute cystitis with hematuria: Secondary | ICD-10-CM | POA: Diagnosis not present

## 2022-03-07 DIAGNOSIS — Z1231 Encounter for screening mammogram for malignant neoplasm of breast: Secondary | ICD-10-CM | POA: Diagnosis not present

## 2022-07-17 DIAGNOSIS — N1831 Chronic kidney disease, stage 3a: Secondary | ICD-10-CM | POA: Diagnosis not present

## 2022-07-17 DIAGNOSIS — D696 Thrombocytopenia, unspecified: Secondary | ICD-10-CM | POA: Diagnosis not present

## 2022-07-17 DIAGNOSIS — R7302 Impaired glucose tolerance (oral): Secondary | ICD-10-CM | POA: Diagnosis not present

## 2022-07-17 DIAGNOSIS — E782 Mixed hyperlipidemia: Secondary | ICD-10-CM | POA: Diagnosis not present

## 2022-07-17 DIAGNOSIS — Z79899 Other long term (current) drug therapy: Secondary | ICD-10-CM | POA: Diagnosis not present

## 2022-07-17 DIAGNOSIS — Z0000 Encounter for general adult medical examination without abnormal findings: Secondary | ICD-10-CM | POA: Diagnosis not present

## 2023-01-16 DIAGNOSIS — R7303 Prediabetes: Secondary | ICD-10-CM | POA: Diagnosis not present

## 2023-01-16 DIAGNOSIS — I10 Essential (primary) hypertension: Secondary | ICD-10-CM | POA: Diagnosis not present

## 2023-01-16 DIAGNOSIS — N1832 Chronic kidney disease, stage 3b: Secondary | ICD-10-CM | POA: Diagnosis not present

## 2023-01-16 DIAGNOSIS — E782 Mixed hyperlipidemia: Secondary | ICD-10-CM | POA: Diagnosis not present

## 2023-01-16 DIAGNOSIS — D696 Thrombocytopenia, unspecified: Secondary | ICD-10-CM | POA: Diagnosis not present

## 2023-07-23 DIAGNOSIS — E782 Mixed hyperlipidemia: Secondary | ICD-10-CM | POA: Diagnosis not present

## 2023-07-23 DIAGNOSIS — I10 Essential (primary) hypertension: Secondary | ICD-10-CM | POA: Diagnosis not present

## 2023-07-23 DIAGNOSIS — Z79899 Other long term (current) drug therapy: Secondary | ICD-10-CM | POA: Diagnosis not present

## 2023-07-23 DIAGNOSIS — Z0000 Encounter for general adult medical examination without abnormal findings: Secondary | ICD-10-CM | POA: Diagnosis not present

## 2023-07-23 DIAGNOSIS — R7302 Impaired glucose tolerance (oral): Secondary | ICD-10-CM | POA: Diagnosis not present

## 2023-07-23 DIAGNOSIS — N1832 Chronic kidney disease, stage 3b: Secondary | ICD-10-CM | POA: Diagnosis not present

## 2023-07-31 DIAGNOSIS — M19011 Primary osteoarthritis, right shoulder: Secondary | ICD-10-CM | POA: Diagnosis not present

## 2023-09-30 DIAGNOSIS — N3001 Acute cystitis with hematuria: Secondary | ICD-10-CM | POA: Diagnosis not present

## 2023-09-30 DIAGNOSIS — R300 Dysuria: Secondary | ICD-10-CM | POA: Diagnosis not present

## 2023-12-20 DIAGNOSIS — N3001 Acute cystitis with hematuria: Secondary | ICD-10-CM | POA: Diagnosis not present

## 2024-01-24 DIAGNOSIS — N1832 Chronic kidney disease, stage 3b: Secondary | ICD-10-CM | POA: Diagnosis not present

## 2024-01-24 DIAGNOSIS — J309 Allergic rhinitis, unspecified: Secondary | ICD-10-CM | POA: Diagnosis not present

## 2024-01-24 DIAGNOSIS — E559 Vitamin D deficiency, unspecified: Secondary | ICD-10-CM | POA: Diagnosis not present

## 2024-01-24 DIAGNOSIS — R7302 Impaired glucose tolerance (oral): Secondary | ICD-10-CM | POA: Diagnosis not present

## 2024-01-24 DIAGNOSIS — R0982 Postnasal drip: Secondary | ICD-10-CM | POA: Diagnosis not present

## 2024-01-24 DIAGNOSIS — E782 Mixed hyperlipidemia: Secondary | ICD-10-CM | POA: Diagnosis not present

## 2024-01-24 DIAGNOSIS — I10 Essential (primary) hypertension: Secondary | ICD-10-CM | POA: Diagnosis not present
